# Patient Record
Sex: Male | Born: 1960 | Race: Black or African American | Hispanic: No | Marital: Married | Smoking: Current every day smoker
Health system: Southern US, Community
[De-identification: ages and names within clinical notes are randomized; demographics above are authoritative.]

## PROBLEM LIST (undated history)

## (undated) DIAGNOSIS — R011 Cardiac murmur, unspecified: Secondary | ICD-10-CM

## (undated) DIAGNOSIS — F319 Bipolar disorder, unspecified: Secondary | ICD-10-CM

## (undated) DIAGNOSIS — I10 Essential (primary) hypertension: Secondary | ICD-10-CM

## (undated) DIAGNOSIS — I219 Acute myocardial infarction, unspecified: Secondary | ICD-10-CM

## (undated) DIAGNOSIS — R519 Headache, unspecified: Secondary | ICD-10-CM

## (undated) DIAGNOSIS — R51 Headache: Secondary | ICD-10-CM

## (undated) DIAGNOSIS — F209 Schizophrenia, unspecified: Secondary | ICD-10-CM

## (undated) DIAGNOSIS — K759 Inflammatory liver disease, unspecified: Secondary | ICD-10-CM

## (undated) HISTORY — PX: COLONOSCOPY: SHX5424

---

## 2014-12-05 ENCOUNTER — Encounter (HOSPITAL_COMMUNITY): Payer: Self-pay | Admitting: Emergency Medicine

## 2014-12-05 ENCOUNTER — Emergency Department (HOSPITAL_COMMUNITY)
Admission: EM | Admit: 2014-12-05 | Discharge: 2014-12-06 | Disposition: A | Discharge status: Other Acute Inpatient Hospital | Payer: Self-pay | Attending: Emergency Medicine | Admitting: Emergency Medicine

## 2014-12-05 DIAGNOSIS — F333 Major depressive disorder, recurrent, severe with psychotic symptoms: Secondary | ICD-10-CM | POA: Insufficient documentation

## 2014-12-05 DIAGNOSIS — F1120 Opioid dependence, uncomplicated: Secondary | ICD-10-CM | POA: Insufficient documentation

## 2014-12-05 DIAGNOSIS — R109 Unspecified abdominal pain: Secondary | ICD-10-CM | POA: Insufficient documentation

## 2014-12-05 DIAGNOSIS — I10 Essential (primary) hypertension: Secondary | ICD-10-CM | POA: Insufficient documentation

## 2014-12-05 DIAGNOSIS — F1994 Other psychoactive substance use, unspecified with psychoactive substance-induced mood disorder: Secondary | ICD-10-CM | POA: Diagnosis present

## 2014-12-05 DIAGNOSIS — F329 Major depressive disorder, single episode, unspecified: Secondary | ICD-10-CM

## 2014-12-05 DIAGNOSIS — R45851 Suicidal ideations: Secondary | ICD-10-CM

## 2014-12-05 DIAGNOSIS — R440 Auditory hallucinations: Secondary | ICD-10-CM

## 2014-12-05 DIAGNOSIS — Z720 Tobacco use: Secondary | ICD-10-CM | POA: Insufficient documentation

## 2014-12-05 DIAGNOSIS — R110 Nausea: Secondary | ICD-10-CM | POA: Insufficient documentation

## 2014-12-05 DIAGNOSIS — F29 Unspecified psychosis not due to a substance or known physiological condition: Secondary | ICD-10-CM | POA: Diagnosis present

## 2014-12-05 HISTORY — DX: Essential (primary) hypertension: I10

## 2014-12-05 HISTORY — DX: Schizophrenia, unspecified: F20.9

## 2014-12-05 LAB — ACETAMINOPHEN LEVEL: Acetaminophen (Tylenol), Serum: <10 ug/mL — ABNORMAL LOW (ref 10–30)

## 2014-12-05 LAB — RAPID URINE DRUG SCREEN, HOSP PERFORMED
Amphetamines: NOT DETECTED
BENZODIAZEPINES: NOT DETECTED
Barbiturates: NOT DETECTED
COCAINE: NOT DETECTED
Opiates: NOT DETECTED
TETRAHYDROCANNABINOL: NOT DETECTED

## 2014-12-05 LAB — CBC
HEMATOCRIT: 40.1 % (ref 39.0–52.0)
Hemoglobin: 13.4 g/dL (ref 13.0–17.0)
MCH: 30.8 pg (ref 26.0–34.0)
MCHC: 33.4 g/dL (ref 30.0–36.0)
MCV: 92.2 fL (ref 78.0–100.0)
PLATELETS: 190 10*3/uL (ref 150–400)
RBC: 4.35 MIL/uL (ref 4.22–5.81)
RDW: 12.4 % (ref 11.5–15.5)
WBC: 9.3 10*3/uL (ref 4.0–10.5)

## 2014-12-05 LAB — COMPREHENSIVE METABOLIC PANEL
ALT: 85 U/L — AB (ref 17–63)
ANION GAP: 9 (ref 5–15)
AST: 63 U/L — AB (ref 15–41)
Albumin: 3.9 g/dL (ref 3.5–5.0)
Alkaline Phosphatase: 66 U/L (ref 38–126)
BUN: 21 mg/dL — AB (ref 6–20)
CALCIUM: 9.4 mg/dL (ref 8.9–10.3)
CHLORIDE: 105 mmol/L (ref 101–111)
CO2: 26 mmol/L (ref 22–32)
CREATININE: 1.05 mg/dL (ref 0.61–1.24)
GFR calc Af Amer: >60 mL/min (ref >60)
GLUCOSE: 100 mg/dL — AB (ref 65–99)
POTASSIUM: 4.2 mmol/L (ref 3.5–5.1)
Sodium: 140 mmol/L (ref 135–145)
Total Bilirubin: 1.4 mg/dL — ABNORMAL HIGH (ref 0.3–1.2)
Total Protein: 7.7 g/dL (ref 6.5–8.1)

## 2014-12-05 LAB — SALICYLATE LEVEL: Salicylate Lvl: <4 mg/dL (ref 2.8–30.0)

## 2014-12-05 LAB — ETHANOL

## 2014-12-05 MED ORDER — ONDANSETRON HCL 4 MG PO TABS: 4.0000 mg | ORAL_TABLET | Freq: Three times a day (TID) | ORAL | Status: DC | PRN

## 2014-12-05 MED ORDER — IBUPROFEN 200 MG PO TABS: 600.0000 mg | ORAL_TABLET | Freq: Three times a day (TID) | ORAL | Status: DC | PRN

## 2014-12-05 MED ORDER — NICOTINE 21 MG/24HR TD PT24
21.0000 mg | MEDICATED_PATCH | Freq: Every day | TRANSDERMAL | Status: DC
  Filled 2014-12-05 (×2): qty 1

## 2014-12-05 MED ORDER — HYDROXYZINE HCL 25 MG PO TABS: 50.0000 mg | ORAL_TABLET | Freq: Three times a day (TID) | ORAL | Status: DC | PRN

## 2014-12-05 MED ORDER — QUETIAPINE FUMARATE 50 MG PO TABS
50.0000 mg | ORAL_TABLET | Freq: Two times a day (BID) | ORAL | Status: DC
  Administered 2014-12-05 – 2014-12-06 (×2): 50 mg via ORAL
  Filled 2014-12-05 (×2): qty 1

## 2014-12-05 MED ORDER — QUETIAPINE FUMARATE 50 MG PO TABS
50.0000 mg | ORAL_TABLET | ORAL | Status: DC
  Administered 2014-12-05: 50 mg via ORAL
  Filled 2014-12-05: qty 1

## 2014-12-05 MED ORDER — ALPRAZOLAM 1 MG PO TABS
1.0000 mg | ORAL_TABLET | Freq: Two times a day (BID) | ORAL | Status: DC
  Administered 2014-12-05 (×2): 1 mg via ORAL
  Filled 2014-12-05 (×3): qty 1

## 2014-12-05 MED ORDER — TRAZODONE HCL 100 MG PO TABS
100.0000 mg | ORAL_TABLET | Freq: Every day | ORAL | Status: DC
  Administered 2014-12-05: 100 mg via ORAL
  Filled 2014-12-05: qty 1

## 2014-12-05 MED ORDER — HALOPERIDOL 5 MG PO TABS: 5.0000 mg | ORAL_TABLET | Freq: Two times a day (BID) | ORAL | Status: DC

## 2014-12-05 NOTE — ED Notes (Signed)
Pt. Noted sleeping in room. No complaints or concerns voiced. No distress or abnormal behavior noted. Will continue to monitor with security cameras. Q 15 minute rounds continue. 
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
## 2014-12-05 NOTE — ED Notes (Signed)
Pt. Noted in room. No complaints or concerns voiced. No distress or abnormal behavior noted. Will continue to monitor with security cameras. Q 15 minute rounds continue. 

## 2014-12-05 NOTE — ED Notes (Signed)
Lab into draw.Pt sleeping, tolerated procedure well. Pt sweaty, reports that he has been getting sweaty when he sleeps for a while.  Pt denies wt loss/cough.

## 2014-12-05 NOTE — ED Notes (Signed)
Eating lunch 

## 2014-12-05 NOTE — ED Notes (Signed)
Pt. To [facility] from ED ambulatory without difficulty, to room 34 . Report from [employer]. Pt. Is alert and oriented, warm and dry in no distress. Pt. Denies SI, HI, and AVH. Pt. Calm and cooperative. Pt. Made aware of security cameras and Q15 minute rounds. Pt. Encouraged to let Nursing staff know of any concerns or needs.

## 2014-12-05 NOTE — ED Notes (Signed)
[facility] into see

## 2014-12-05 NOTE — ED Notes (Signed)
Report received from Janie Rambo RN. Pt. Sleeping, respirations regular and unlabored. Will continue to monitor for safety via security cameras and Q 15 minute checks. 

## 2014-12-05 NOTE — ED Provider Notes (Signed)
CSN: [id number]     Arrival date & time 12/05/14  0407 History   First MD Initiated Contact with Patient 12/05/14 [phone]     Chief Complaint  Patient presents with  . Suicidal     (Consider location/radiation/quality/duration/timing/severity/associated sxs/prior Treatment) HPI Comments: Patient was discharged from a psychiatric hospital in [place].  He was told to take a train to [place] to stay at the [facility] and to follow-up with [facility].  Patient states he got off the bus.  He hadn't eaten anything in the day and half.  He had some abdominal cramping.  He felt very anxious.  He caught his left wrist with an unknown sharp object and called the ambulance to bring him to the hospital because he was feeling suicidal.  He does have a long-standing history of psychiatric illness.  He does take Desyrel and Seroquel and Haldol and Xanax, which he states he hasn't taken in a while.  Patient is a 54 y.o. male presenting with abdominal pain. The history is provided by the patient.  Abdominal Pain Pain location:  Generalized Pain quality: cramping   Pain radiates to:  Does not radiate Pain severity:  Mild Onset quality:  Gradual Timing:  Intermittent Chronicity:  New Context comment:  Has aeaten for a  day and half Relieved by:  None tried Worsened by:  Nothing tried Ineffective treatments:  None tried Associated symptoms: nausea   Associated symptoms: no chills, no constipation, no cough, no diarrhea, no dysuria and no fever     Past Medical History  Diagnosis Date  . Hypertension   . Schizophrenia    No past surgical history on file. Family History  Problem Relation Age of Onset  . Alcoholism Father    Social History  Substance Use Topics  . Smoking status: Current Every Day Smoker -- 0.50 packs/day    Types: Cigarettes  . Smokeless tobacco: Never Used  . Alcohol Use: No    Review of Systems  Constitutional: Negative for fever and chills.   Respiratory: Negative for cough.   Gastrointestinal: Positive for nausea and abdominal pain. Negative for diarrhea and constipation.  Genitourinary: Negative for dysuria.  Neurological: Positive for dizziness.  All other systems reviewed and are negative.     Allergies  Review of patient's allergies indicates no known allergies.  Home Medications   Prior to Admission medications   Medication Sig Start Date End Date Taking? Authorizing Provider  haloperidol (HALDOL) 5 MG tablet Take 5 mg by mouth 2 (two) times daily.   Yes Historical Provider, MD  hydrOXYzine (ATARAX/VISTARIL) 50 MG tablet Take 50 mg by mouth 3 (three) times daily as needed for anxiety.   Yes Historical Provider, MD  QUEtiapine (SEROQUEL) 50 MG tablet Take 50 mg by mouth every morning.   Yes Historical Provider, MD  traZODone (DESYREL) 100 MG tablet Take 100 mg by mouth at bedtime.   Yes Historical Provider, MD   BP 103/60 mmHg  Pulse 72  Temp(Src) 98.5 F (36.9 C) (Oral)  Resp 18  SpO2 96% Physical Exam  Constitutional: He is oriented to person, place, and time. He appears well-developed and well-nourished.  HENT:  Head: Normocephalic.  Eyes: Pupils are equal, round, and reactive to light.  Neck: Normal range of motion.  Cardiovascular: Normal rate and regular rhythm.   Pulmonary/Chest: Effort normal and breath sounds normal.  Abdominal: Soft. Bowel sounds are normal. He exhibits no distension.  Musculoskeletal: Normal range of motion.  Neurological:  He is alert and oriented to person, place, and time.  Skin: Skin is warm and dry.  Psychiatric: His speech is normal and behavior is normal. His mood appears anxious. Cognition and memory are normal. He expresses inappropriate judgment. He exhibits a depressed mood. He expresses suicidal ideation. He expresses suicidal plans.  Nursing note and vitals reviewed.   ED Course  Procedures (including critical care time) Labs Review Labs Reviewed  COMPREHENSIVE  METABOLIC PANEL - Abnormal; Notable for the following:    Glucose, Bld 100 (*)    BUN 21 (*)    AST 63 (*)    ALT 85 (*)    Total Bilirubin 1.4 (*)    All other components within normal limits  ACETAMINOPHEN LEVEL - Abnormal; Notable for the following:    Acetaminophen (Tylenol), Serum <10 (*)    All other components within normal limits  HEPATITIS C ANTIBODY - Abnormal; Notable for the following:    HCV Ab >11.0 (*)    All other components within normal limits  ETHANOL  SALICYLATE LEVEL  CBC  URINE RAPID DRUG SCREEN, HOSP PERFORMED  HEPATITIS A ANTIBODY, TOTAL  HEPATITIS B CORE ANTIBODY, TOTAL  HIV ANTIBODY (ROUTINE TESTING)    Imaging Review No results found. I, Dafney Farler K, personally reviewed and evaluated these images and lab results as part of my medical decision-making.   EKG Interpretation None      MDM   Final diagnoses:  MDD (major depressive disorder), recurrent, severe, with psychosis  Heroin dependence  Suicidal ideation         Junius Creamer, NP 12/08/14 1953  Varney Biles, MD 12/09/14 1016
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
## 2014-12-05 NOTE — ED Notes (Signed)
Pt states he has had continuous suicidal thoughts for "a while.'  Pt states that he got off the bus and was feeling anxious.  Pt states he has the shakes, nauseated, and doesn't feel well.

## 2014-12-05 NOTE — BH Assessment (Signed)
Received notification of TTS consult request. Spoke to Junius Creamer, NP who said Pt has a history of mental health problems and suicidal ideation. Tele-assessment will be initiated.  Orpah Greek Anson Fret, [place], [facility], [facility] Triage Specialist [phone]

## 2014-12-05 NOTE — ED Notes (Signed)
Patient arrives by EMS, states he took a train from [place] to [place] and has been staying at [facility].  Patient states he has been off his medications a week ago last Thursday.  Patient complaining of abdominal cramping, no diarrhea, complaining of dizziness, nausea, no vomiting.  Patient states suicidal thoughts and per EMS, patient stated he cut his wrists 2 days ago.

## 2014-12-06 ENCOUNTER — Inpatient Hospital Stay (HOSPITAL_COMMUNITY)
Admission: AD | Admit: 2014-12-06 | Discharge: 2014-12-13 | DRG: 885 | Disposition: A | Discharge status: Home / Self Care | Payer: Federal, State, Local not specified - Other | Source: Intra-hospital | Attending: Emergency Medicine | Admitting: Emergency Medicine

## 2014-12-06 ENCOUNTER — Encounter (HOSPITAL_COMMUNITY): Payer: Self-pay | Admitting: *Deleted

## 2014-12-06 DIAGNOSIS — I10 Essential (primary) hypertension: Secondary | ICD-10-CM | POA: Diagnosis present

## 2014-12-06 DIAGNOSIS — Y92239 Unspecified place in hospital as the place of occurrence of the external cause: Secondary | ICD-10-CM | POA: Diagnosis not present

## 2014-12-06 DIAGNOSIS — F22 Delusional disorders: Secondary | ICD-10-CM | POA: Diagnosis present

## 2014-12-06 DIAGNOSIS — F1120 Opioid dependence, uncomplicated: Secondary | ICD-10-CM

## 2014-12-06 DIAGNOSIS — F250 Schizoaffective disorder, bipolar type: Principal | ICD-10-CM | POA: Diagnosis present

## 2014-12-06 DIAGNOSIS — W19XXXA Unspecified fall, initial encounter: Secondary | ICD-10-CM

## 2014-12-06 DIAGNOSIS — F17210 Nicotine dependence, cigarettes, uncomplicated: Secondary | ICD-10-CM | POA: Diagnosis present

## 2014-12-06 DIAGNOSIS — W010XXA Fall on same level from slipping, tripping and stumbling without subsequent striking against object, initial encounter: Secondary | ICD-10-CM | POA: Diagnosis not present

## 2014-12-06 DIAGNOSIS — F419 Anxiety disorder, unspecified: Secondary | ICD-10-CM | POA: Diagnosis present

## 2014-12-06 DIAGNOSIS — Z560 Unemployment, unspecified: Secondary | ICD-10-CM | POA: Diagnosis present

## 2014-12-06 DIAGNOSIS — Z915 Personal history of self-harm: Secondary | ICD-10-CM

## 2014-12-06 DIAGNOSIS — Z811 Family history of alcohol abuse and dependence: Secondary | ICD-10-CM

## 2014-12-06 DIAGNOSIS — Z590 Homelessness: Secondary | ICD-10-CM

## 2014-12-06 DIAGNOSIS — F333 Major depressive disorder, recurrent, severe with psychotic symptoms: Secondary | ICD-10-CM

## 2014-12-06 DIAGNOSIS — G4700 Insomnia, unspecified: Secondary | ICD-10-CM | POA: Diagnosis present

## 2014-12-06 DIAGNOSIS — B1920 Unspecified viral hepatitis C without hepatic coma: Secondary | ICD-10-CM | POA: Clinically undetermined

## 2014-12-06 LAB — HEPATITIS C ANTIBODY

## 2014-12-06 LAB — HIV ANTIBODY (ROUTINE TESTING W REFLEX): HIV SCREEN 4TH GENERATION: NONREACTIVE

## 2014-12-06 LAB — HEPATITIS B CORE ANTIBODY, TOTAL: HEP B C TOTAL AB: NEGATIVE

## 2014-12-06 LAB — HEPATITIS A ANTIBODY, TOTAL: Hep A Total Ab: NEGATIVE

## 2014-12-06 MED ORDER — NICOTINE 21 MG/24HR TD PT24
21.0000 mg | MEDICATED_PATCH | Freq: Every day | TRANSDERMAL | Status: DC
  Administered 2014-12-07 – 2014-12-11 (×5): 21 mg via TRANSDERMAL
  Filled 2014-12-06 (×4): qty 1
  Filled 2014-12-06 (×2): qty 14
  Filled 2014-12-06: qty 1
  Filled 2014-12-06 (×2): qty 14

## 2014-12-06 MED ORDER — IBUPROFEN 600 MG PO TABS
600.0000 mg | ORAL_TABLET | Freq: Three times a day (TID) | ORAL | Status: DC | PRN
  Administered 2014-12-08 – 2014-12-11 (×5): 600 mg via ORAL
  Filled 2014-12-06 (×5): qty 1

## 2014-12-06 MED ORDER — TRAZODONE HCL 100 MG PO TABS
100.0000 mg | ORAL_TABLET | Freq: Every day | ORAL | Status: DC
  Administered 2014-12-07 – 2014-12-09 (×3): 100 mg via ORAL
  Filled 2014-12-06 (×5): qty 1

## 2014-12-06 MED ORDER — MAGNESIUM HYDROXIDE 400 MG/5ML PO SUSP: 30.0000 mL | Freq: Every day | ORAL | Status: DC | PRN

## 2014-12-06 MED ORDER — QUETIAPINE FUMARATE 50 MG PO TABS
50.0000 mg | ORAL_TABLET | Freq: Two times a day (BID) | ORAL | Status: DC
  Administered 2014-12-06 – 2014-12-10 (×8): 50 mg via ORAL
  Filled 2014-12-06 (×11): qty 1
  Filled 2014-12-06 (×2): qty 28

## 2014-12-06 MED ORDER — ACETAMINOPHEN 325 MG PO TABS
650.0000 mg | ORAL_TABLET | Freq: Four times a day (QID) | ORAL | Status: DC | PRN
  Administered 2014-12-06 – 2014-12-09 (×5): 650 mg via ORAL
  Filled 2014-12-06 (×6): qty 2

## 2014-12-06 MED ORDER — HYDROXYZINE HCL 50 MG PO TABS
50.0000 mg | ORAL_TABLET | Freq: Three times a day (TID) | ORAL | Status: DC | PRN
  Administered 2014-12-07 – 2014-12-12 (×4): 50 mg via ORAL
  Filled 2014-12-06 (×3): qty 1
  Filled 2014-12-06: qty 20
  Filled 2014-12-06: qty 1

## 2014-12-06 MED ORDER — ALUM & MAG HYDROXIDE-SIMETH 200-200-20 MG/5ML PO SUSP: 30.0000 mL | ORAL | Status: DC | PRN

## 2014-12-06 MED ORDER — ENSURE ENLIVE PO LIQD
237.0000 mL | Freq: Two times a day (BID) | ORAL | Status: DC
  Administered 2014-12-07 – 2014-12-12 (×9): 237 mL via ORAL
  Filled 2014-12-06: qty 237

## 2014-12-06 MED ORDER — ONDANSETRON HCL 4 MG PO TABS: 4.0000 mg | ORAL_TABLET | Freq: Three times a day (TID) | ORAL | Status: DC | PRN

## 2014-12-06 NOTE — ED Notes (Signed)
Pt. Noted sleeping in room. No complaints or concerns voiced. No distress or abnormal behavior noted. Will continue to monitor with security cameras. Q 15 minute rounds continue. 

## 2014-12-06 NOTE — Progress Notes (Signed)
Patient ID: Ronnie Burton, male   DOB: 1960/08/29, 54 y.o.   MRN: [id number] D: Client in his room most of this shift, pleasant, reports came to [facility] for "a new start" notes that his ex-GF once lived here and suggested he come. Client reports he moved here from [place].  "here to get straightened out" "get the help I need to stay on my medicine" A: Writer reviewed medication, administered as prescribed, encouraged client to attend group. Staff will monitor q27min for safety. R:Client is safe on the unit.

## 2014-12-06 NOTE — Tx Team (Signed)
Initial Interdisciplinary Treatment Plan   PATIENT STRESSORS: Homelessness Substance abuse  PATIENT STRENGTHS: Ability for insight General fund of knowledge   PROBLEM LIST: Problem List/Patient Goals Date to be addressed Date deferred Reason deferred Estimated date of resolution  Suicidal ideation 8/16   D/c  Substance abuse/heroin 8/16   D/c  Homelessness/financial difficulty 8/16   D/c                                       DISCHARGE CRITERIA:  Ability to meet basic life and health needs Motivation to continue treatment in a less acute level of care Safe-care adequate arrangements made  PRELIMINARY DISCHARGE PLAN: Attend PHP/IOP Outpatient therapy Placement in alternative living arrangements  PATIENT/FAMIILY INVOLVEMENT: This treatment plan has been presented to and reviewed with the patient, Ronnie Burton, and/or family member,  The patient and family have been given the opportunity to ask questions and make suggestions.  Jobe Igo 12/06/2014, 7:20 PM
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
## 2014-12-06 NOTE — BH Assessment (Signed)
[place] Assessment Progress Note  Per Mojeed [email or web address].MD, this pt requires psychiatric hospitalization at this time.  Debarah Crape, RN, [facility] has assigned pt to [facility] Rm [id number].  Pt has signed Voluntary Admission and Consent for Treatment, as well as Consent to Release Information, and a notification call has been placed.  Signed forms have been faxed to [facility].  Pt's nurse, Nena Jordan, has been notified, and agrees to send original paperwork along with pt via [facility], and to call report to [phone].  Jalene Mullet, [place] Triage Specialist [phone]

## 2014-12-06 NOTE — Progress Notes (Signed)
Nursing admit note-  Ronnie Burton is a 54y/o AAM admitted voluntarily following medical clearance from [facility].  He presented with SI with a plan to cut his wrist following relocation from [place]. Is homeless with no support system.  Reports long history of heroin abuse with last use 2 weeks ago.  States multiple hospitilizations.  Currently pleasant and cooperative with vague thoughts of suicide and no plan.  Contracts for safety.  No physical complaints.  Currently reports mild anxiety.  Also reports recent 30lb weight loss.  Desires help with depressive symptoms and housing assistance.  Food/fluids given.  Orientation to unit done and POC explained.  15' checks initiated for safety.  NAD.

## 2014-12-07 ENCOUNTER — Telehealth: Payer: Self-pay | Admitting: Internal Medicine

## 2014-12-07 ENCOUNTER — Telehealth (HOSPITAL_COMMUNITY): Payer: Self-pay

## 2014-12-07 ENCOUNTER — Encounter (HOSPITAL_COMMUNITY): Payer: Self-pay | Admitting: Psychiatry

## 2014-12-07 DIAGNOSIS — F250 Schizoaffective disorder, bipolar type: Principal | ICD-10-CM | POA: Diagnosis present

## 2014-12-07 DIAGNOSIS — B1920 Unspecified viral hepatitis C without hepatic coma: Secondary | ICD-10-CM | POA: Clinically undetermined

## 2014-12-07 DIAGNOSIS — F1120 Opioid dependence, uncomplicated: Secondary | ICD-10-CM | POA: Diagnosis present

## 2014-12-07 MED ORDER — GABAPENTIN 100 MG PO CAPS
100.0000 mg | ORAL_CAPSULE | Freq: Three times a day (TID) | ORAL | Status: DC
  Administered 2014-12-07 – 2014-12-08 (×4): 100 mg via ORAL
  Filled 2014-12-07 (×9): qty 1
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
## 2014-12-07 NOTE — Plan of Care (Signed)
Problem: Diagnosis: Increased Risk For Suicide Attempt Goal: LTG-Patient Will Report Improved Mood and Deny Suicidal LTG (by discharge) Patient will report improved mood and deny suicidal ideation.  Outcome: Progressing Client is safe on the unit AEB denys SHI, q15min safety checks.

## 2014-12-07 NOTE — Progress Notes (Signed)
D: Pt presents with flat affect and depressed mood which has brightened up as shift progressed. Pt denies SI, HI, AVH and pain this AM when assessed. Pt is guarded with minimal interactions noted with others, however, he's pleasant. Pt did not attend AM nursing group when prompted stated "I'm tired" but he did attend the noon group.  A: Verbal education done on prescribed medications prior to administration. EKG done as per MD's orders. Emotional support and availability offered to pt. Pt encouraged to voice needs and concerns. Q 15 minutes checks done for safety as order without gestures of self injurious behavior or outburst to note at this time.  R: Pt took his medications when offered. Denies adverse drug reactions when assessed. Tolerated EKG procedure and meals well. Remains safe on and off unit.

## 2014-12-07 NOTE — [facility] Group Notes (Signed)
[facility] LCSW Group Therapy  12/07/2014 , 1:33 PM   Type of Therapy:  Group Therapy  Participation Level:  Active  Participation Quality:  Attentive  Affect:  Appropriate  Cognitive:  Alert  Insight:  Improving  Engagement in Therapy:  Engaged  Modes of Intervention:  Discussion, Exploration and Socialization  Summary of Progress/Problems: Today's group focused on the term Diagnosis.  Participants were asked to define the term, and then pronounce whether it is a negative, positive or neutral term.  Invited.  Chose to not attend.  Ronnie Burton 12/07/2014 , 1:33 PM

## 2014-12-07 NOTE — [facility] Counselor (Signed)
CSW attempted to complete PSA but patient declined at this time, reports feeling tired and asked for CSW to come back at a later time.  Tilden Fossa, MSW, [place] Worker [facility] [phone]

## 2014-12-07 NOTE — H&P (Signed)
Psychiatric Admission Assessment Adult  Patient Identification: Ronnie Burton MRN:  [id number] Date of Evaluation:  12/07/2014 Chief Complaint:Pt states " I was depressed and suicidal.'       Principal Diagnosis: Schizoaffective disorder, bipolar type versus substance induced ( opioid ) bipolar and related disorder. Diagnosis:   Patient Active Problem List   Diagnosis Date Noted  . Opioid use disorder, severe, dependence [F11.20] 12/07/2014  . Hepatitis C [B19.20] 12/07/2014  . Schizoaffective disorder, bipolar type [F25.0] 12/07/2014   History of Present Illness:: Pt is a 54 y.o. AA male, divorced, unemployed , who has a hx of schizophrenia vs Bipolar do as well as severe opioid use disorder, presented to  to [facility] ED reporting symptoms of depression and command auditory hallucinations to kill himself. Pt per initial notes in EHR " reported he was discharged from [facility] in [place] two days ago after being admitted for cutting his wrist in a suicide attempt. Pt says he was discharged on 12/03/14 and given a bus pass to [place] with discharged instructions to go to [employer] and a list of behavior health resources [place]. Pt states he did not have money to purchase his psychiatric medications. Pt states he arrived in [place] and came to [facility] because he was suicidal with thoughts of cutting his wrist. Pt also reported hearing command auditory hallucinations to "do it" and kill himself."  Pt seen this AM. Pt today appears irritable - states he needs some sleep after the 18 hr long bus trip. Pt reports periodic mood lability when he is either irritable , angry or happy and does reckless things like abuse drugs or spend money inappropriately. Pt also reports AH asking him to kill self. Pt reports paranoia that people are out to get him from time to time. Pt reports appetite as good . Pt currently has SI , with the same plan.   Pt reports that  this is his 4 th hospitalization. He has been admitted three times in [place] . Pt has had several suicide attempts - he recently tried to cut his wrist as described above.  Pt reports abusing IV heroin on a regular basis. He last used it couple of weeks ago. Pt reports having withdrawal sx like having cold sweats when not using it. Pt also has an ongoing BL tremors of his hands , which is chronic. Pt reports being prescribed xanax for the same.  Pt with recent Hep C ab positive while in ED - discussed that he will need follow up on the same. Pt reports that he was never diagnosed with Hep c in the past.     Elements:  Location:  depression, mood lability, psychosis. Quality:  see above. Severity: severe. Timing:  acute Duration: past 3 days  Context:  ..schizophrenia per hx, opioid use do Associated Signs/Symptoms: Depression Symptoms:  anhedonia, anxiety, panic attacks, (Hypo) Manic Symptoms:  Distractibility, Elevated Mood, Hallucinations, Impulsivity, Irritable Mood, Labiality of Mood, Anxiety Symptoms:  denies Psychotic Symptoms:  Delusions, Hallucinations: Auditory Paranoia, PTSD Symptoms: Negative Total Time spent with patient: 1 hour  Past Medical History:  Past Medical History  Diagnosis Date  . Hypertension   . Schizophrenia    History reviewed. No pertinent past surgical history. Family History:  Family History  Problem Relation Age of Onset  . Alcoholism Father    Social History:  History  Alcohol Use No     History  Drug Use No    Social History  Social History  . Marital Status: Single    Spouse Name: N/A  . Number of Children: N/A  . Years of Education: N/A   Social History Main Topics  . Smoking status: Current Every Day Smoker -- 0.50 packs/day    Types: Cigarettes  . Smokeless tobacco: Never Used  . Alcohol Use: No  . Drug Use: No  . Sexual Activity: Yes   Other Topics Concern  . None   Social History Narrative   Additional Social  History:    Pain Medications: Denies abuse Prescriptions: See MAR Over the Counter: See MAR Longest period of sobriety (when/how long): Two years Negative Consequences of Use: Financial, Scientist, research (physical sciences), Personal relationships, Work / School Name of Substance 1: Heroin (I.V and inhalation) 1 - Age of First Use: 30 1 - Amount (size/oz): 10+ bags 1 - Frequency: Daily 1 - Duration: Ongoing for years 1 - Last Use / Amount: Two weeks ago          Patient was born [place]. Pt went up to 12 th grade. Pt is divorced . Pt has a daughter in [place] and mother in [place]. Pt denies legal issues. Pt is unemployed - wants to apply for SSD.         Musculoskeletal: Strength & Muscle Tone: within normal limits Gait & Station: normal Patient leans: N/A  Psychiatric Specialty Exam: Physical Exam  Constitutional: He is oriented to person, place, and time. He appears well-developed and well-nourished.  HENT:  Head: Normocephalic and atraumatic.  Eyes: Conjunctivae and EOM are normal.  Neck: Normal range of motion. Neck supple. No thyromegaly present.  Cardiovascular: Normal rate and regular rhythm.   Respiratory: Effort normal and breath sounds normal.  GI: Soft. Bowel sounds are normal.  Musculoskeletal: Normal range of motion. He exhibits no edema.  Neurological: He is alert and oriented to person, place, and time.  Skin: Skin is warm.  Psychiatric: His speech is normal. His mood appears anxious. He is actively hallucinating. Thought content is paranoid and delusional. Cognition and memory are normal. He expresses impulsivity. He exhibits a depressed mood.    Review of Systems  Psychiatric/Behavioral: Positive for depression, suicidal ideas, hallucinations and substance abuse. The patient has insomnia.   All other systems reviewed and are negative.   Blood pressure 108/72, pulse 88, temperature 97.7 F (36.5 C), temperature source Oral, resp. rate 20, height 6' 5"  (1.956 m), weight 81.647 kg (180 lb), SpO2 100  %.Body mass index is 21.34 kg/(m^2).  General Appearance: Disheveled  Eye Contact::  Poor  Speech:  Normal Rate  Volume:  Normal  Mood:  Anxious and Irritable  Affect:  Congruent  Thought Process:  Irrelevant  Orientation:  Full (Time, Place, and Person)  Thought Content:  Delusions, Hallucinations: Auditory, Paranoid Ideation and Rumination  Suicidal Thoughts:  Yes.  with intent/plan  Homicidal Thoughts:  No  Memory:  Immediate;   Fair Recent;   Fair Remote;   Fair  Judgement:  Impaired  Insight:  Lacking  Psychomotor Activity:  Normal  Concentration:  Fair  Recall:  [employer] of Knowledge:Fair  Language: Fair  Akathisia:  No  Handed:  Right  AIMS (if indicated):     Assets:  Desire for Improvement  ADL's:  Intact  Cognition: WNL  Sleep:  Number of Hours: 6.75   Risk to Self:  YES Risk to Others:  DENIES Prior Inpatient Therapy:  YES- 3X IN [place] Prior Outpatient Therapy:  YES ,IN [place]  Alcohol Screening:  1. How often do you have a drink containing alcohol?: Never 9. Have you or someone else been injured as a result of your drinking?: No 10. Has a relative or friend or a doctor or another health worker been concerned about your drinking or suggested you cut down?: No Alcohol Use Disorder Identification Test Final Score (AUDIT): 0  Allergies:  No Known Allergies Lab Results: No results found for this or any previous visit (from the past 76 hour(s)). Current Medications: Current Facility-Administered Medications  Medication Dose Route Frequency Provider Last Rate Last Dose  . acetaminophen (TYLENOL) tablet 650 mg  650 mg Oral Q6H PRN Patrecia Pour, NP   650 mg at 12/06/14 2046  . alum & mag hydroxide-simeth (MAALOX/MYLANTA) 200-200-20 MG/5ML suspension 30 mL  30 mL Oral Q4H PRN Patrecia Pour, NP      . feeding supplement (ENSURE ENLIVE) (ENSURE ENLIVE) liquid 237 mL  237 mL Oral BID BM Norena Bratton, MD   237 mL at 12/07/14 1054  . gabapentin (NEURONTIN) capsule 100 mg   100 mg Oral TID Ursula Alert, MD   100 mg at 12/07/14 1247  . hydrOXYzine (ATARAX/VISTARIL) tablet 50 mg  50 mg Oral TID PRN Patrecia Pour, NP      . ibuprofen (ADVIL,MOTRIN) tablet 600 mg  600 mg Oral Q8H PRN Patrecia Pour, NP      . magnesium hydroxide (MILK OF MAGNESIA) suspension 30 mL  30 mL Oral Daily PRN Patrecia Pour, NP      . nicotine (NICODERM CQ - dosed in mg/24 hours) patch 21 mg  21 mg Transdermal Daily Patrecia Pour, NP   21 mg at 12/07/14 0835  . ondansetron (ZOFRAN) tablet 4 mg  4 mg Oral Q8H PRN Patrecia Pour, NP      . QUEtiapine (SEROQUEL) tablet 50 mg  50 mg Oral BID Patrecia Pour, NP   50 mg at 12/07/14 0834  . traZODone (DESYREL) tablet 100 mg  100 mg Oral QHS Patrecia Pour, NP   100 mg at 12/06/14 2200   PTA Medications: Prescriptions prior to admission  Medication Sig Dispense Refill Last Dose  . haloperidol (HALDOL) 5 MG tablet Take 5 mg by mouth 2 (two) times daily.   Past Month at Unknown time  . hydrOXYzine (ATARAX/VISTARIL) 50 MG tablet Take 50 mg by mouth 3 (three) times daily as needed for anxiety.   Past Month at Unknown time  . QUEtiapine (SEROQUEL) 50 MG tablet Take 50 mg by mouth every morning.   Past Month at Unknown time  . traZODone (DESYREL) 100 MG tablet Take 100 mg by mouth at bedtime.   Past Month at Unknown time    Previous Psychotropic Medications: Yes - seroquel   Substance Abuse History in the last 12 months:  Yes.  heroin IV abuse - chronic -last use 2 weeks ago. Smokes cigarettes.    Consequences of Substance Abuse: Medical Consequences:  recent admission Withdrawal Symptoms:   Diaphoresis Tremors  Results for orders placed or performed during the hospital encounter of 12/05/14 (from the past 72 hour(s))  Comprehensive metabolic panel     Status: Abnormal   Collection Time: 12/05/14  5:16 AM  Result Value Ref Range   Sodium 140 135 - 145 mmol/L   Potassium 4.2 3.5 - 5.1 mmol/L   Chloride 105 101 - 111 mmol/L   CO2 26 22 -  32 mmol/L   Glucose, Bld 100 (H) 65 - 99  mg/dL   BUN 21 (H) 6 - 20 mg/dL   Creatinine, Ser 1.05 0.61 - 1.24 mg/dL   Calcium 9.4 8.9 - 10.3 mg/dL   Total Protein 7.7 6.5 - 8.1 g/dL   Albumin 3.9 3.5 - 5.0 g/dL   AST 63 (H) 15 - 41 U/L   ALT 85 (H) 17 - 63 U/L   Alkaline Phosphatase 66 38 - 126 U/L   Total Bilirubin 1.4 (H) 0.3 - 1.2 mg/dL   GFR calc non Af Amer >60 >60 mL/min   GFR calc Af Amer >60 >60 mL/min    Comment: (NOTE) The eGFR has been calculated using the CKD EPI equation. This calculation has not been validated in all clinical situations. eGFR's persistently <60 mL/min signify possible Chronic Kidney Disease.    Anion gap 9 5 - 15  Ethanol (ETOH)     Status: None   Collection Time: 12/05/14  5:16 AM  Result Value Ref Range   Alcohol, Ethyl (B) <5 <5 mg/dL    Comment:        LOWEST DETECTABLE LIMIT FOR SERUM ALCOHOL IS 5 mg/dL FOR MEDICAL PURPOSES ONLY   Salicylate level     Status: None   Collection Time: 12/05/14  5:16 AM  Result Value Ref Range   Salicylate Lvl <8.1 2.8 - 30.0 mg/dL  Acetaminophen level     Status: Abnormal   Collection Time: 12/05/14  5:16 AM  Result Value Ref Range   Acetaminophen (Tylenol), Serum <10 (L) 10 - 30 ug/mL    Comment:        THERAPEUTIC CONCENTRATIONS VARY SIGNIFICANTLY. A RANGE OF 10-30 ug/mL MAY BE AN EFFECTIVE CONCENTRATION FOR MANY PATIENTS. HOWEVER, SOME ARE BEST TREATED AT CONCENTRATIONS OUTSIDE THIS RANGE. ACETAMINOPHEN CONCENTRATIONS >150 ug/mL AT 4 HOURS AFTER INGESTION AND >50 ug/mL AT 12 HOURS AFTER INGESTION ARE OFTEN ASSOCIATED WITH TOXIC REACTIONS.   CBC     Status: None   Collection Time: 12/05/14  5:16 AM  Result Value Ref Range   WBC 9.3 4.0 - 10.5 K/uL   RBC 4.35 4.22 - 5.81 MIL/uL   Hemoglobin 13.4 13.0 - 17.0 g/dL   HCT 40.1 39.0 - 52.0 %   MCV 92.2 78.0 - 100.0 fL   MCH 30.8 26.0 - 34.0 pg   MCHC 33.4 30.0 - 36.0 g/dL   RDW 12.4 11.5 - 15.5 %   Platelets 190 150 - 400 K/uL  Urine rapid  drug screen (hosp performed) (Not at [facility])     Status: None   Collection Time: 12/05/14  5:46 AM  Result Value Ref Range   Opiates NONE DETECTED NONE DETECTED   Cocaine NONE DETECTED NONE DETECTED   Benzodiazepines NONE DETECTED NONE DETECTED   Amphetamines NONE DETECTED NONE DETECTED   Tetrahydrocannabinol NONE DETECTED NONE DETECTED   Barbiturates NONE DETECTED NONE DETECTED    Comment:        DRUG SCREEN FOR MEDICAL PURPOSES ONLY.  IF CONFIRMATION IS NEEDED FOR ANY PURPOSE, NOTIFY LAB WITHIN 5 DAYS.        LOWEST DETECTABLE LIMITS FOR URINE DRUG SCREEN Drug Class       Cutoff (ng/mL) Amphetamine      1000 Barbiturate      200 Benzodiazepine   275 Tricyclics       170 Opiates          300 Cocaine          300 THC  50   Hepatitis C antibody     Status: Abnormal   Collection Time: 12/05/14 12:39 PM  Result Value Ref Range   HCV Ab >11.0 (H) 0.0 - 0.9 s/co ratio    Comment: (NOTE)                                  Negative:     < 0.8                             Indeterminate: 0.8 - 0.9                                  Positive:     > 0.9 The CDC recommends that a positive HCV antibody result be followed up with a HCV Nucleic Acid Amplification test (696789). Performed At: [facility] [place], [place] [id number] Lindon Romp MD [id number]   Hepatitis A antibody, total     Status: None   Collection Time: 12/05/14 12:39 PM  Result Value Ref Range   Hep A Total Ab Negative Negative    Comment: (NOTE) Performed At: [facility] [address] [place], [place] [id number] Lindon Romp MD [id number]   Hepatitis B core antibody, total     Status: None   Collection Time: 12/05/14 12:39 PM  Result Value Ref Range   Hep B Core Total Ab Negative Negative    Comment: (NOTE) Performed At: [facility] [place], [place] [id number] Lindon Romp MD [id number]   HIV antibody     Status: None    Collection Time: 12/05/14 12:39 PM  Result Value Ref Range   HIV Screen 4th Generation wRfx Non Reactive Non Reactive    Comment: (NOTE) Performed At: [facility] [place], [place] [id number] Lindon Romp MD [id number]     Observation Level/Precautions:  15 minute checks  Laboratory:  . Will get Hba1c,EKG,TSH,lipid panel,UDS ,CMP,CBC, PL  if not already done   Psychotherapy:  Individual and group therapy   Medications:  See below  Consultations:  Social worker  Discharge Concerns:  Stability and safety  Estimated LOS:  Other:     Psychological Evaluations: No   Treatment Plan Summary: Daily contact with patient to assess and evaluate symptoms and progress in treatment and Medication management   Patient will benefit from inpatient treatment and stabilization.  Estimated length of stay is 5-7 days.  Reviewed past medical records,treatment plan.  Will continue Seroquel 50 mg po bid as scheduled for psychosis. Will add Gabapentin 100 mg po tid for mood sx,tremors. Will continue trazodone 100 mg po qhs for sleep. Will make available Vistaril 25 mg po tid prn for anxiety sx.   Will continue to monitor vitals ,medication compliance and treatment side effects while patient is here.  Will monitor for medical issues as well as call consult as needed.  Reviewed labs ,cmp reviewed- AST/ALT elevated - Hep C ab positive - will get Hospitalist consult for further recommendations. HIV-non reactive, Hep a,b - non reactive. Will get TSH . CSW will start working on disposition. Will get collateral info from [place]. Patient to participate in therapeutic milieu .       Medical Decision Making:  New problem, with additional work up planned,  Review of Psycho-Social Stressors (1), Review or order clinical lab tests (1), Established Problem, Worsening (2), Review of Last Therapy Session (1), Review of Medication Regimen & Side Effects (2) and Review of New  Medication or Change in Dosage (2)  I certify that inpatient services furnished can reasonably be expected to improve the patient's condition.   Lowen Mansouri MD 8/16/20161:56 PM

## 2014-12-07 NOTE — Progress Notes (Signed)
NUTRITION ASSESSMENT  Pt identified as at risk on the Malnutrition Screen Tool  INTERVENTION: 1. Educated patient on the importance of nutrition and encouraged intake of food and beverages. 2. Discussed weight goals. 3. Supplements: continue Ensure Enlive BID, each supplement provides 350 kcal and 20 grams of protein  NUTRITION DIAGNOSIS: Unintentional weight loss related to sub-optimal intake as evidenced by pt report.   Goal: Pt to meet >/= 90% of their estimated nutrition needs.  Monitor:  PO intake  Assessment:  Pt seen for MST. Pt is homeless with SI. He was using heroin PTA. Per notes, pt reports 30 lb weight loss. This wound indicate 14% body weight in unknown time frame.  Ensure Enlive has been ordered BID.  54 y.o. male  Height: Ht Readings from Last 1 Encounters:  12/06/14 6\' 5"  (1.956 m)    Weight: Wt Readings from Last 1 Encounters:  12/06/14 180 lb (81.647 kg)    Weight Hx: Wt Readings from Last 10 Encounters:  12/06/14 180 lb (81.647 kg)    BMI:  Body mass index is 21.34 kg/(m^2). Pt meets criteria for normal weight based on current BMI.  Estimated Nutritional Needs: Kcal: 25-30 kcal/kg Protein: > 1 gram protein/kg Fluid: 1 ml/kcal  Diet Order: Diet Heart Room service appropriate?: Yes; Fluid consistency:: Thin Pt is also offered choice of unit snacks mid-morning and mid-afternoon.  Pt is eating as desired.   Lab results and medications reviewed.      Jarome Matin, RD, LDN Inpatient Clinical Dietitian Pager # [phone] After hours/weekend pager # [phone]

## 2014-12-07 NOTE — BHH Suicide Risk Assessment (Signed)
[facility] Admission Suicide Risk Assessment   Nursing information obtained from:  Patient Demographic factors:  Male Current Mental Status:  Suicidal ideation indicated by patient Loss Factors:  Financial problems / change in socioeconomic status Historical Factors:  Family history of suicide Risk Reduction Factors:  Positive therapeutic relationship Total Time spent with patient: 30 minutes Principal Problem: Major depressive disorder, recurrent episode, severe, with psychotic behavior Diagnosis:   Patient Active Problem List   Diagnosis Date Noted  . Opioid use disorder, severe, dependence [F11.20] 12/07/2014  . Hepatitis C [B19.20] 12/07/2014  . Major depressive disorder, recurrent episode, severe, with psychotic behavior [F33.3] 12/06/2014     Continued Clinical Symptoms:  Alcohol Use Disorder Identification Test Final Score (AUDIT): 0 The "Alcohol Use Disorders Identification Test", Guidelines for Use in Primary Care, Second Edition.  World Pharmacologist [facility]). Score between 0-7:  no or low risk or alcohol related problems. Score between 8-15:  moderate risk of alcohol related problems. Score between 16-19:  high risk of alcohol related problems. Score 20 or above:  warrants further diagnostic evaluation for alcohol dependence and treatment.   CLINICAL FACTORS:   Alcohol/Substance Abuse/Dependencies Unstable or Poor Therapeutic Relationship Previous Psychiatric Diagnoses and Treatments   Musculoskeletal: Strength & Muscle Tone: within normal limits Gait & Station: normal Patient leans: N/A  Psychiatric Specialty Exam: Physical Exam  ROS  Blood pressure 108/72, pulse 88, temperature 97.7 F (36.5 C), temperature source Oral, resp. rate 20, height 6\' 5"  (1.956 m), weight 81.647 kg (180 lb), SpO2 100 %.Body mass index is 21.34 kg/(m^2).                  Please see H&P.                                        COGNITIVE FEATURES THAT  CONTRIBUTE TO RISK:  Closed-mindedness, Polarized thinking and Thought constriction (tunnel vision)    SUICIDE RISK:   Moderate:  Frequent suicidal ideation with limited intensity, and duration, some specificity in terms of plans, no associated intent, good self-control, limited dysphoria/symptomatology, some risk factors present, and identifiable protective factors, including available and accessible social support.  PLAN OF CARE: Please see H&P.   Medical Decision Making:  New problem, with additional work up planned, Review of Psycho-Social Stressors (1), Review or order clinical lab tests (1), Established Problem, Worsening (2), Review of Last Therapy Session (1), Review of Medication Regimen & Side Effects (2) and Review of New Medication or Change in Dosage (2)  I certify that inpatient services furnished can reasonably be expected to improve the patient's condition.   Sly Parlee MD 12/07/2014, 1:30 PM

## 2014-12-07 NOTE — Telephone Encounter (Signed)
I was called and informed that Ronnie Burton was found to be hepatitis C antibody positive and asked for further recommendations. The next step is to obtain a hepatitis C RNA PCR viral load to see if he has active hepatitis. If it is positive he can be referred for outpatient followup in our clinic. His HIV antibody is negative.

## 2014-12-07 NOTE — Progress Notes (Signed)
Patient ID: Ronnie Burton, male   DOB: [birth year], 54 y.o.   MRN: [id number] D: Client visible on unit in dayroom watching TV, reports depression "6" of 10, anxiety "7" of 10. 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 A: Writer provided emotional support, encouraged client to move forward in recovery. Reviewed medications, administered as prescribed. Staff will monitor q78min for safety. R: Client is safe on the unit, attended group.

## 2014-12-07 NOTE — Plan of Care (Signed)
Problem: Diagnosis: Increased Risk For Suicide Attempt Goal: STG-Patient Will Comply With Medication Regime Outcome: Progressing Pt took his medications as prescribed when offered. Denied adverse drug reactions at time of assessment. Encouraged to voice needs.

## 2014-12-07 NOTE — Progress Notes (Signed)
The focus of this group is to help patients review their daily goal of treatment and discuss progress on daily workbooks. Pt attended the evening group session and responded to all discussion prompts from the [place]. Pt shared that today was a good day on the unit, the highlights of which were connecting with his daughter on the phone and talking with his roommate throughout the day. Pt's only additional request from Nursing Staff this evening was for towels and soap, which were given to him following group. On the topic of coping skills, Ronnie Burton shared that he liked to play basketball and create music, both lifelong hobbies and coping skills. Pt's affect was appropriate and he volunteered several encouraging comments to his peers.

## 2014-12-07 NOTE — BHH Group Notes (Signed)
[place] Group Notes:  (Nursing/Recovery)  Date:  12/07/2014  Time:  0930 Type of Therapy:  Nurse Education  Participation Level:  Did Not Attend Summary of Progress/Problems: Pt did not attend scheduled groups this AM despite prompts /encouragement from nursing staff, stated "I'm tired and went back to sleep" [place] [employer] 12/07/2014, 0930

## 2014-12-07 NOTE — Telephone Encounter (Signed)
Lab result back (+) HCV Ab.  Pt was admitted to [facility].  Report given to [facility].

## 2014-12-08 DIAGNOSIS — B1920 Unspecified viral hepatitis C without hepatic coma: Secondary | ICD-10-CM

## 2014-12-08 LAB — TSH: TSH: 1.047 u[IU]/mL (ref 0.350–4.500)

## 2014-12-08 MED ORDER — GABAPENTIN 100 MG PO CAPS
200.0000 mg | ORAL_CAPSULE | Freq: Three times a day (TID) | ORAL | Status: DC
  Administered 2014-12-08 – 2014-12-13 (×11): 200 mg via ORAL
  Filled 2014-12-08: qty 84
  Filled 2014-12-08: qty 2
  Filled 2014-12-08: qty 84
  Filled 2014-12-08: qty 2
  Filled 2014-12-08: qty 84
  Filled 2014-12-08 (×2): qty 2
  Filled 2014-12-08 (×4): qty 84
  Filled 2014-12-08 (×3): qty 2
  Filled 2014-12-08 (×2): qty 84
  Filled 2014-12-08: qty 2
  Filled 2014-12-08 (×2): qty 84
  Filled 2014-12-08: qty 2
  Filled 2014-12-08: qty 84

## 2014-12-08 MED ORDER — LIDOCAINE 5 % EX PTCH
1.0000 | MEDICATED_PATCH | CUTANEOUS | Status: DC
  Administered 2014-12-08 – 2014-12-11 (×4): 1 via TRANSDERMAL
  Filled 2014-12-08 (×7): qty 1

## 2014-12-08 NOTE — BHH Group Notes (Signed)
[facility] Mental Health Association Group Therapy  12/08/2014 , 2:19 PM    Type of Therapy:  Mental Health Association Presentation  Participation Level:  Active  Participation Quality:  Attentive  Affect:  Blunted  Cognitive:  Oriented  Insight:  Limited  Engagement in Therapy:  Engaged  Modes of Intervention:  Discussion, Education and Socialization  Summary of Progress/Problems:  Shanon Brow from [place] came to present his recovery story and play the guitar.  Wandered in at the end of group.  Listened quietly to the last two pieces of music.  Roque Lias B 12/08/2014 , 2:19 PM

## 2014-12-08 NOTE — Progress Notes (Signed)
Spoke with Hospitalist MD and recommendations to follow regarding pos results on Hep panel.  HCV quant pending results.  Patient's dc orders to include follow up with GI Dept.

## 2014-12-08 NOTE — BHH Group Notes (Signed)
[facility] LCSW Aftercare Discharge Planning Group Note   12/08/2014 2:18 PM  Participation Quality:  Invited.  Chose to not attend.    Roque Lias B

## 2014-12-08 NOTE — Progress Notes (Signed)
Question about positive hep C ab Please obtain hep c viral load and LFT's May need GI and ID follow up depending on results  No need for official consult as per our discussion with NP  Leisa Lenz [facility] [phone]

## 2014-12-08 NOTE — Progress Notes (Signed)
[facility] MD Progress Note  12/08/2014 2:32 PM Ronnie Burton  MRN:  [id number] Subjective: Patient states " I am in pain.'  Objective: Pt is a 54 y.o. AA male, divorced, unemployed , who has a hx of schizophrenia vs Bipolar do as well as severe opioid use disorder, presented to to [facility] ED reporting symptoms of depression and command auditory hallucinations to kill himself. Pt per initial notes in EHR " reported he was discharged from [facility] in [place] two days ago after being admitted for cutting his wrist in a suicide attempt. Pt says he was discharged on 12/03/14 and given a bus pass to [place] with discharged instructions to go to [employer] and a list of behavior health resources in [place].  Patient seen and chart reviewed.Discussed patient with treatment team. Pt today seen in bed , appears to be withdrawn , but cooperative. Pt reports back pain issues, and feeling anxious about it. Pt otherwise denies any new complaints . Denies AH/VH/SI. Pt with very limited insight - states he was placed on a bus to come to [place] by his hospital for unknown reasons and he feels that his GF has something to do with it. His GF is still in [place] . Pt with newly pos Hep C ab.  Per staff - no disruptive issues noted on the unit.     Principal Problem: Schizoaffective disorder, bipolar type Diagnosis:   Patient Active Problem List   Diagnosis Date Noted  . Opioid use disorder, severe, dependence [F11.20] 12/07/2014  . Hepatitis C [B19.20] 12/07/2014  . Schizoaffective disorder, bipolar type [F25.0] 12/07/2014   Total Time spent with patient: 20 minutes   Past Medical History:  Past Medical History  Diagnosis Date  . Hypertension   . Schizophrenia    History reviewed. No pertinent past surgical history. Family History:  Family History  Problem Relation Age of Onset  . Alcoholism Father    Social History:  History  Alcohol Use No     History  Drug  Use No    Social History   Social History  . Marital Status: Single    Spouse Name: N/A  . Number of Children: N/A  . Years of Education: N/A   Social History Main Topics  . Smoking status: Current Every Day Smoker -- 0.50 packs/day    Types: Cigarettes  . Smokeless tobacco: Never Used  . Alcohol Use: No  . Drug Use: No  . Sexual Activity: Yes   Other Topics Concern  . None   Social History Narrative   Additional History:    Sleep: Fair  Appetite:  Fair     Musculoskeletal: Strength & Muscle Tone: within normal limits Gait & Station: normal Patient leans: N/A   Psychiatric Specialty Exam: Physical Exam  Review of Systems  Musculoskeletal: Positive for back pain.  Psychiatric/Behavioral: Positive for depression and substance abuse. The patient is nervous/anxious.   All other systems reviewed and are negative.   Blood pressure 116/70, pulse 93, temperature 97.9 F (36.6 C), temperature source Oral, resp. rate 20, height 6\' 5"  (1.956 m), weight 81.647 kg (180 lb), SpO2 100 %.Body mass index is 21.34 kg/(m^2).  General Appearance: Disheveled  Eye Contact::  Minimal  Speech:  Normal Rate  Volume:  Decreased  Mood:  Anxious  Affect:  Appropriate  Thought Process:  Coherent  Orientation:  Full (Time, Place, and Person)  Thought Content:  Rumination  Suicidal Thoughts:  No  Homicidal Thoughts:  No  Memory:  Immediate;   Fair Recent;   Fair Remote;   Fair  Judgement:  Impaired  Insight:  Lacking  Psychomotor Activity:  Decreased  Concentration:  Poor  Recall:  [employer] of Knowledge:Fair  Language: Fair  Akathisia:  No  Handed:  Right  AIMS (if indicated):     Assets:  Others:  access to health care  ADL's:  Intact  Cognition: WNL  Sleep:  Number of Hours: 6.75     Current Medications: Current Facility-Administered Medications  Medication Dose Route Frequency Provider Last Rate Last Dose  . acetaminophen (TYLENOL) tablet 650 mg  650 mg Oral Q6H  PRN Patrecia Pour, NP   650 mg at 12/06/14 2046  . alum & mag hydroxide-simeth (MAALOX/MYLANTA) 200-200-20 MG/5ML suspension 30 mL  30 mL Oral Q4H PRN Patrecia Pour, NP      . feeding supplement (ENSURE ENLIVE) (ENSURE ENLIVE) liquid 237 mL  237 mL Oral BID BM Jamere Stidham, MD   237 mL at 12/08/14 1141  . gabapentin (NEURONTIN) capsule 200 mg  200 mg Oral TID Ursula Alert, MD      . hydrOXYzine (ATARAX/VISTARIL) tablet 50 mg  50 mg Oral TID PRN Patrecia Pour, NP   50 mg at 12/07/14 1952  . ibuprofen (ADVIL,MOTRIN) tablet 600 mg  600 mg Oral Q8H PRN Patrecia Pour, NP      . lidocaine (LIDODERM) 5 % 1 patch  1 patch Transdermal Q24H Pearline Yerby, MD      . magnesium hydroxide (MILK OF MAGNESIA) suspension 30 mL  30 mL Oral Daily PRN Patrecia Pour, NP      . nicotine (NICODERM CQ - dosed in mg/24 hours) patch 21 mg  21 mg Transdermal Daily Patrecia Pour, NP   21 mg at 12/08/14 0800  . ondansetron (ZOFRAN) tablet 4 mg  4 mg Oral Q8H PRN Patrecia Pour, NP      . QUEtiapine (SEROQUEL) tablet 50 mg  50 mg Oral BID Patrecia Pour, NP   50 mg at 12/08/14 0804  . traZODone (DESYREL) tablet 100 mg  100 mg Oral QHS Patrecia Pour, NP   100 mg at 12/07/14 2107    Lab Results:  Results for orders placed or performed during the hospital encounter of 12/06/14 (from the past 48 hour(s))  TSH     Status: None   Collection Time: 12/08/14  6:55 AM  Result Value Ref Range   TSH 1.047 0.350 - 4.500 uIU/mL    Comment: Performed at [facility]    Physical Findings: AIMS: Facial and Oral Movements Muscles of Facial Expression: None, normal Lips and Perioral Area: None, normal Jaw: None, normal Tongue: None, normal,Extremity Movements Upper (arms, wrists, hands, fingers): None, normal Lower (legs, knees, ankles, toes): None, normal, Trunk Movements Neck, shoulders, hips: None, normal, Overall Severity Severity of abnormal movements (highest score from questions above): None,  normal Incapacitation due to abnormal movements: None, normal Patient's awareness of abnormal movements (rate only patient's report): No Awareness, Dental Status Current problems with teeth and/or dentures?: Yes (multiple missing teeth)  CIWA:    COWS:     Assessment: Pt is a 54 y.o. AA male, divorced, unemployed , who has a hx of schizophrenia vs Bipolar do as well as severe opioid use disorder, presented to to [facility] ED reporting symptoms of depression and command auditory hallucinations to kill himself. Pt with limited insight in to his  illness, stays withdrawn , has been compliant on his medications. Will continue treatment.   Treatment Plan Summary: Daily contact with patient to assess and evaluate symptoms and progress in treatment and Medication management Will continue Seroquel 50 mg po bid as scheduled for psychosis. Will increase Gabapentin to 200 mg po tid for mood sx,tremors, pain. Will continue trazodone 100 mg po qhs for sleep. Will make available Vistaril 25 mg po tid prn for anxiety sx. Add Lidocaine patch for pain.  Will continue to monitor vitals ,medication compliance and treatment side effects while patient is here.  Will monitor for medical issues as well as call consult as needed.  Reviewed labs ,cmp reviewed- AST/ALT elevated - Hep C ab positive - will get Hospitalist consult for further recommendations.  TSH reviewed- wnl. CSW will start working on disposition. Will get collateral info from [place]. Patient to participate in therapeutic milieu .      Medical Decision Making:  Review of Psycho-Social Stressors (1), Review or order clinical lab tests (1), Decision to obtain old records (1), Review of Last Therapy Session (1), Review of Medication Regimen & Side Effects (2) and Review of New Medication or Change in Dosage (2)     Satrina Magallanes MD 12/08/2014, 2:32 PM

## 2014-12-08 NOTE — Progress Notes (Addendum)
D:  Pt flat/depressed during interaction, denies SI/HI/AVH, slept poor last night, appetite fair, concentration good, energy level low, and pt is drinking ensures between meals, contracts for safety, interacts appropriately, rates depression at a 5 out of 10, rates hopelessness at a 3 out of 10, rates anxiety at an 8 out of 10, goal today: "to work on getting the help I need"  A:  Emotional support provided, Encouraged pt to continue with treatment plan and attend all group activities, q15 min checks maintained for safety.  R:  Pt is receptive, going to some groups, needs some encouragement to go to groups and come out of room, pleasant and cooperative with staff and other patients on the unit.

## 2014-12-08 NOTE — BHH Suicide Risk Assessment (Signed)
[facility] INPATIENT:  Family/Significant Other Suicide Prevention Education  Suicide Prevention Education:  Education Completed; Daughter Metro Kung [phone],  (name of family member/significant other) has been identified by the patient as the family member/significant other with whom the patient will be residing, and identified as the person(s) who will aid the patient in the event of a mental health crisis (suicidal ideations/suicide attempt).  With written consent from the patient, the family member/significant other has been provided the following suicide prevention education, prior to the and/or following the discharge of the patient.  The suicide prevention education provided includes the following:  Suicide risk factors  Suicide prevention and interventions  National Suicide Hotline telephone number  [facility] assessment telephone number  [facility] Emergency Assistance [place] and/or Residential Mobile Crisis Unit telephone number  Request made of family/significant other to:  Remove weapons (e.g., guns, rifles, knives), all items previously/currently identified as safety concern.    Remove drugs/medications (over-the-counter, prescriptions, illicit drugs), all items previously/currently identified as a safety concern.  The family member/significant other verbalizes understanding of the suicide prevention education information provided.  The family member/significant other agrees to remove the items of safety concern listed above.  Ronnie Burton, Casimiro Needle 12/08/2014, 3:30 PM

## 2014-12-08 NOTE — Progress Notes (Signed)
Pt states at 2100 he slipped on water in his room unwitnessed and caught himself halfway on his bed and the floor. RN noted that patient did not have his non-slip socks on, but had his socks from home on without shoes. Pt states he does "not like the hospital socks". RN informed pt that he had to wear them on the unit while ambulating. Pt verbally agrees; Therapist, sports retrieved his non-slip sock that were on patient's shelf in his room, which were given to him to wear. Vital signs taken and WNL; no s/s of distress noted. Pt resting in bed and states his right ankle is sore. No swelling of ankle noted on assessment. Respirations regular and unlabored. Glenda Chroman, NP notified at 2101; [employer] RN notified at 2103, Tori [facility] notified at 2103. Cori, NP in to assess patient in his room at 2120. Pt denies hitting his head or hurting any other areas. No new orders received from provider.

## 2014-12-08 NOTE — Progress Notes (Signed)
Did not attend group 

## 2014-12-09 ENCOUNTER — Inpatient Hospital Stay (HOSPITAL_COMMUNITY): Payer: Federal, State, Local not specified - Other

## 2014-12-09 DIAGNOSIS — W19XXXA Unspecified fall, initial encounter: Secondary | ICD-10-CM

## 2014-12-09 MED ORDER — IBUPROFEN 800 MG PO TABS: 800.0000 mg | ORAL_TABLET | Freq: Three times a day (TID) | ORAL | Status: DC

## 2014-12-09 MED ORDER — ACETAMINOPHEN 325 MG PO TABS
650.0000 mg | ORAL_TABLET | Freq: Once | ORAL | Status: AC
  Administered 2014-12-09: 650 mg via ORAL
  Filled 2014-12-09: qty 2

## 2014-12-09 NOTE — Progress Notes (Signed)
D: Patient with dull, flat affect but brightens on approach. Pt declined to go to karaoke group this evening, but instead remained in the group room watching television.  A: Q 15 minute safety checks, reorient as needed, encourage staff/peer interaction, medication compliance and group participation. R: Patient denies SI/HI at this time. Pt compliant with medications this shift.

## 2014-12-09 NOTE — Progress Notes (Signed)
Pt has been complaining of rt shoulder, wrist, knee and ankle pain today from falling on night shift last night, pt states that he is unable to get out of bed and ambulate today due to the pain, MD notified, new order given for patient to go to ED for evaluation, AC and Charge RN notified, [facility] called for transportation.

## 2014-12-09 NOTE — Progress Notes (Signed)
D: Patient withdrawn and with dull, flat affect. Pt denies SI or plans to harm himself this shift. Pt with minimal interaction with peers in the milieu. A: Q 15 minute safety checks, encourage staff/peer interactions, administer medications as ordered by MD. Encourage group session participation. R: Pt compliant with HS medications. No distress noted.

## 2014-12-09 NOTE — Progress Notes (Signed)
54 yr old medicaid of [place] pt who states he just moved to [place] on 12/08/14 and has not been to DSS nor SSI to change medicaid services  CM spoke with pt to discuss the importance of going to DSS/SSI or online to reapply for [place] medicaid to get assist with getting a pcp.  Explained if coverage not changed he may be considered as uninsured in [place] eventually CM discussed and provided written information for  accepting pcps, discussed the importance of pcp vs EDP services for f/u care, www.needymeds.org, www.goodrx.com, discounted pharmacies and other [employer] such as [employer], affordable care act, financial assistance, uninsured dental services, [place] med assist, DSS and  health department  Reviewed resources for [employer] uninsured accepting pcps like [facility], family medicine at [employer], [facility], [facility], local urgent care centers, [facility], [facility] family practice, general medical clinics, family services of the [place], [facility] urgent care plus others, medication resources, [facility] out patient pharmacies and housing Pt voiced understanding and appreciation of resources provided   Provided [facility] contact information Pt informed he is not eligible at this time for [facility] but may assist as transitional medical care resources if he does not get assist from DSS Pt provided with a list of [place] providers to assist him

## 2014-12-09 NOTE — Progress Notes (Signed)
D:  Pt returns from [facility], Per pt self inventory pt reports sleeping poor, appetite fair, energy level low, ability to pay attention good, rates depression at a 5 out of 10, hopelessness at a 5 out of 10, anxiety at an 8 out of 10, denies HI/AVH, endorses SI on and off, contracts for safety, flat/depressed during interaction, goal today: "Take my meds and get myself together"    A:  Emotional support provided, Encouraged pt to continue with treatment plan and attend all group activities, q15 min checks maintained for safety.  R:  Pt is not going to groups and has been staying in his bed today while on the unit, pt went to [facility] this am for evaluation after falling last night on night shift, pt returned with ace wraps to rt ankle and rt wrist.

## 2014-12-09 NOTE — BHH Group Notes (Signed)
Adult Psychoeducational Group Note  Date:  12/09/2014 Time:  0900am  Group Topic/Focus:  Goals Group:   The focus of this group is to help patients establish daily goals to achieve during treatment and discuss how the patient can incorporate goal setting into their daily lives to aide in recovery. Orientation:   The focus of this group is to educate the patient on the purpose and policies of crisis stabilization and provide a format to answer questions about their admission.  The group details unit policies and expectations of patients while admitted.  Participation Level:  Did Not Attend   Ronnie Burton 12/09/2014, 1:29 PM

## 2014-12-09 NOTE — ED Notes (Addendum)
Pt brought over by [facility] after a slip and fall last night.  C/o R ankle, R hip, R hand, and R wrist pain.  Pain score 7/10.  No deformities noted.

## 2014-12-09 NOTE — Progress Notes (Signed)
[facility] MD Progress Note  12/09/2014 3:19 PM Ronnie Burton  MRN:  [id number] Subjective: Patient states " I slipped and fell and I am in pain."  Objective: Pt is a 54 y.o. AA male, divorced, unemployed , who has a hx of schizophrenia vs Bipolar do as well as severe opioid use disorder, presented to to [facility] ED reporting symptoms of depression and command auditory hallucinations to kill himself. Pt per initial notes in EHR " reported he was discharged from [facility] in [place] two days ago after being admitted for cutting his wrist in a suicide attempt. Pt says he was discharged on 12/03/14 and given a bus pass to [place] with discharged instructions to go to [employer] and a list of behavior health resources [place].  Patient seen and chart reviewed.Discussed patient with treatment team. Pt today seen in bed , appears to be in excruciating pain. Pt reports that he slipped and fell last night hitting his right side against the floor. Pt on exam - has limited ROM of his right shoulder , with tenderness - around his shoulder, more so on the axillary aspect . No swelling noted . Pt also reports ROM of his right LE as well as his Rt.side knee joint and hip joint. Has gait instability - limited weight bearing on his right side when asked to walk. Pt reports feeling extremely anxious and depressed about his situation. Discussed with pt that he will be send to ED for further evaluation. Pt reports improved AH , denies SI.  Per staff - pt reported a fall last night - unwitnessed - has been refusing to get out of bed this AM 2/2 pain.   Principal Problem: Schizoaffective disorder, bipolar type Diagnosis:   Patient Active Problem List   Diagnosis Date Noted  . Opioid use disorder, severe, dependence [F11.20] 12/07/2014  . Hepatitis C [B19.20] 12/07/2014  . Schizoaffective disorder, bipolar type [F25.0] 12/07/2014   Total Time spent with patient: 30  minutes   Past Medical History:  Past Medical History  Diagnosis Date  . Hypertension   . Schizophrenia    History reviewed. No pertinent past surgical history. Family History:  Family History  Problem Relation Age of Onset  . Alcoholism Father    Social History:  History  Alcohol Use No     History  Drug Use No    Social History   Social History  . Marital Status: Single    Spouse Name: N/A  . Number of Children: N/A  . Years of Education: N/A   Social History Main Topics  . Smoking status: Current Every Day Smoker -- 0.50 packs/day    Types: Cigarettes  . Smokeless tobacco: Never Used  . Alcohol Use: No  . Drug Use: No  . Sexual Activity: Yes   Other Topics Concern  . None   Social History Narrative   Additional History:    Sleep: Poor 2/2 pain  Appetite:  Poor 2/2 pain     Musculoskeletal: Strength & Muscle Tone: within normal limits Gait & Station: normal Patient leans: N/A   Psychiatric Specialty Exam: Physical Exam  Musculoskeletal:       Right shoulder: He exhibits decreased range of motion and tenderness.       Left shoulder: Normal.       Right elbow: Normal.      Left elbow: Normal.       Right wrist: Normal.       Left  wrist: Normal.       Right hip: He exhibits decreased range of motion.       Left hip: Normal.       Right knee: He exhibits decreased range of motion. Tenderness found.       Left knee: Normal.       Right ankle: Tenderness.       Left ankle: Normal.    Review of Systems  Musculoskeletal: Positive for myalgias, back pain, joint pain and falls.  Psychiatric/Behavioral: Positive for depression and substance abuse. The patient is nervous/anxious.   All other systems reviewed and are negative.   Blood pressure 126/65, pulse 79, temperature 97.7 F (36.5 C), temperature source Oral, resp. rate 16, height 6\' 5"  (1.956 m), weight 81.647 kg (180 lb), SpO2 100 %.Body mass index is 21.34 kg/(m^2).  General Appearance:  Disheveled  Eye Contact::  Minimal  Speech:  Normal Rate  Volume:  Decreased  Mood:  Anxious and Depressed  Affect:  Depressed  Thought Process:  Coherent  Orientation:  Full (Time, Place, and Person)  Thought Content:  Hallucinations: Auditory and Rumination improving  Suicidal Thoughts:  No  Homicidal Thoughts:  No  Memory:  Immediate;   Fair Recent;   Fair Remote;   Fair  Judgement:  Impaired  Insight:  Lacking  Psychomotor Activity:  Decreased  Concentration:  Poor  Recall:  [employer] of Knowledge:Fair  Language: Fair  Akathisia:  No  Handed:  Right  AIMS (if indicated):     Assets:  Others:  access to health care  ADL's:  Intact  Cognition: WNL  Sleep:  Number of Hours: 6.75     Current Medications: Current Facility-Administered Medications  Medication Dose Route Frequency Provider Last Rate Last Dose  . acetaminophen (TYLENOL) tablet 650 mg  650 mg Oral Q6H PRN Patrecia Pour, NP   650 mg at 12/09/14 0753  . alum & mag hydroxide-simeth (MAALOX/MYLANTA) 200-200-20 MG/5ML suspension 30 mL  30 mL Oral Q4H PRN Patrecia Pour, NP      . feeding supplement (ENSURE ENLIVE) (ENSURE ENLIVE) liquid 237 mL  237 mL Oral BID BM Dyana Magner, MD   237 mL at 12/09/14 1410  . gabapentin (NEURONTIN) capsule 200 mg  200 mg Oral TID Ursula Alert, MD   200 mg at 12/09/14 0754  . hydrOXYzine (ATARAX/VISTARIL) tablet 50 mg  50 mg Oral TID PRN Patrecia Pour, NP   50 mg at 12/07/14 1952  . ibuprofen (ADVIL,MOTRIN) tablet 600 mg  600 mg Oral Q8H PRN Patrecia Pour, NP   600 mg at 12/09/14 0754  . lidocaine (LIDODERM) 5 % 1 patch  1 patch Transdermal Q24H Ursula Alert, MD   1 patch at 12/08/14 1822  . magnesium hydroxide (MILK OF MAGNESIA) suspension 30 mL  30 mL Oral Daily PRN Patrecia Pour, NP      . nicotine (NICODERM CQ - dosed in mg/24 hours) patch 21 mg  21 mg Transdermal Daily Patrecia Pour, NP   21 mg at 12/09/14 0754  . ondansetron (ZOFRAN) tablet 4 mg  4 mg Oral Q8H PRN  Patrecia Pour, NP      . QUEtiapine (SEROQUEL) tablet 50 mg  50 mg Oral BID Patrecia Pour, NP   50 mg at 12/09/14 0754  . traZODone (DESYREL) tablet 100 mg  100 mg Oral QHS Patrecia Pour, NP   100 mg at 12/08/14 2132   Current Outpatient Prescriptions  Medication Sig Dispense Refill  . haloperidol (HALDOL) 5 MG tablet Take 5 mg by mouth 2 (two) times daily.    . hydrOXYzine (ATARAX/VISTARIL) 50 MG tablet Take 50 mg by mouth 3 (three) times daily as needed for anxiety.    Marland Kitchen ibuprofen (ADVIL,MOTRIN) 800 MG tablet Take 1 tablet (800 mg total) by mouth 3 (three) times daily. 21 tablet 0  . QUEtiapine (SEROQUEL) 50 MG tablet Take 50 mg by mouth every morning.    . traZODone (DESYREL) 100 MG tablet Take 100 mg by mouth at bedtime.      Lab Results:  Results for orders placed or performed during the hospital encounter of 12/06/14 (from the past 48 hour(s))  TSH     Status: None   Collection Time: 12/08/14  6:55 AM  Result Value Ref Range   TSH 1.047 0.350 - 4.500 uIU/mL    Comment: Performed at [facility]    Physical Findings: AIMS: Facial and Oral Movements Muscles of Facial Expression: None, normal Lips and Perioral Area: None, normal Jaw: None, normal Tongue: None, normal,Extremity Movements Upper (arms, wrists, hands, fingers): None, normal Lower (legs, knees, ankles, toes): None, normal, Trunk Movements Neck, shoulders, hips: None, normal, Overall Severity Severity of abnormal movements (highest score from questions above): None, normal Incapacitation due to abnormal movements: None, normal Patient's awareness of abnormal movements (rate only patient's report): No Awareness, Dental Status Current problems with teeth and/or dentures?: Yes (multiple missing teeth)  CIWA:    COWS:     Assessment: Pt is a 54 y.o. AA male, divorced, unemployed , who has a hx of schizophrenia vs Bipolar do as well as severe opioid use disorder, presented to to [facility] ED  reporting symptoms of depression and command auditory hallucinations to kill himself. Pt with limited insight in to his illness, stays withdrawn , has been compliant on his medications.  Discussed with patient that he will be send to ED for evaluation of fall. Will continue treatment.   Treatment Plan Summary: Daily contact with patient to assess and evaluate symptoms and progress in treatment and Medication management  Patient to be send to ED for evaluation - S/P Fall .  Will continue Seroquel 50 mg po bid as scheduled for psychosis. Increased Gabapentin to 200 mg po tid for mood sx,tremors, pain. Will continue trazodone 100 mg po qhs for sleep. Will make available Vistaril 25 mg po tid prn for anxiety sx. Add Lidocaine patch for pain.Ice pack as needed.  Will continue to monitor vitals ,medication compliance and treatment side effects while patient is here.  Will monitor for medical issues as well as call consult as needed.  Reviewed labs ,cmp reviewed- AST/ALT elevated - Hep C ab positive - as per Hospitalist consult - pt to have HCV RNA quant test- no further recommendations at this time. CSW will start working on disposition. Will get collateral info from [facility]. Patient to participate in therapeutic milieu .      Medical Decision Making:  Review of Psycho-Social Stressors (1), Review or order clinical lab tests (1), Decision to obtain old records (1), Review of Last Therapy Session (1), Review of Medication Regimen & Side Effects (2) and Review of New Medication or Change in Dosage (2)     Ovidio Steele MD 12/09/2014, 3:19 PM

## 2014-12-09 NOTE — [facility] Group Notes (Signed)
[facility] Group Notes:  (Counselor/Nursing/MHT/Case Management/Adjunct)  12/09/2014 1:15PM  Type of Therapy:  Group Therapy  Participation Level:  Pt not here at the beginning of group.  Seen in hall when group was in session.  Did not join [place].  Summary of Progress/Problems: The topic for group was balance in life.  Pt participated in the discussion about when their life was in balance and out of balance and how this feels.  Pt discussed ways to get back in balance and short term goals they can work on to get where they want to be.    Trish Mage 12/09/2014 3:48 PM

## 2014-12-09 NOTE — Progress Notes (Signed)
Pt got out of bed and was able to dress himself, wheelchair provided to take pt to transportation, transported to [facility] via [facility] transportation, MHT went with patient to ED and will stay with pt in ED.

## 2014-12-09 NOTE — ED Notes (Signed)
Pt and sitter not in lobby-[facility] called 30 minutes ago for transport to [facility]

## 2014-12-09 NOTE — Discharge Instructions (Signed)
Fall Prevention in Hospitals Take ibuprofen for pain.  As a hospital patient, your condition and the treatments you receive can increase your risk for falls. Some additional risk factors for falls in a hospital include:  Being in an unfamiliar environment.  Being on bed rest.  Your surgery.  Taking certain medicines.  Your tubing requirements, such as intravenous (IV) therapy or catheters. It is important that you learn how to decrease fall risks while at the hospital. Below are important tips that can help prevent falls. SAFETY TIPS FOR PREVENTING FALLS Talk about your risk of falling.  Ask your caregiver why you are at risk for falling. Is it your medicine, illness, tubing placement, or something else?  Make a plan with your caregiver to keep you safe from falls.  Ask your caregiver or pharmacist about side effect of your medicines. Some medicines can make you dizzy or affect your coordination. Ask for help.  Ask for help before getting out of bed. You may need to press your call button.  Ask for assistance in getting you safely to the toilet.  Ask for a walker or cane to be put at your bedside. Ask that most of the side rails on your bed be placed up before your caregiver leaves the room.  Ask family or friends to sit with you.  Ask for things that are out of your reach, such as your glasses, hearing aids, telephone, bedside table, or call button. Follow these tips to avoid falling:  Stay lying or seated, rather than standing, while waiting for help.  Wear rubber-soled slippers or shoes whenever you walk in the hospital.  Avoid quick, sudden movements.  Change positions slowly.  Sit on the side of your bed before standing.  Stand up slowly and wait before you start to walk.  Let your caregiver know if there is a spill on the floor.  Pay careful attention to the medical equipment, electrical cords, and tubes around you.  When you need help, use your call button by  your bed or in the bathroom. Wait for one of your caregivers to help you.  If you feel dizzy or unsure of your footing, return to bed and wait for assistance.  Avoid being distracted by the TV, telephone, or another person in your room.  Do not lean or support yourself on rolling objects, such as IV poles or bedside tables. Document Released: 04/06/2000 Document Revised: 03/26/2012 Document Reviewed: 12/16/2011 [facility] Patient Information 2015 [place], [place]. This information is not intended to replace advice given to you by your health care provider. Make sure you discuss any questions you have with your health care provider.

## 2014-12-09 NOTE — ED Provider Notes (Signed)
CSN: [id number]     Arrival date & time 12/09/14  1048 History   First MD Initiated Contact with Patient 12/09/14 1107     Chief Complaint  Patient presents with  . Fall  . Wrist Pain     (Consider location/radiation/quality/duration/timing/severity/associated sxs/prior Treatment) The history is provided by the patient. No language interpreter was used.  Ronnie Burton is a 54 y.o male who was brought over by [facility] after a slip and fall last night. He is complaining of entire right side of his body being in pain. He states after the fall he was able to get up and ambulate. He denies taking anything for pain prior to arrival. He denies any numbness or tingling. He denies any head injury or loss of consciousness.  Past Medical History  Diagnosis Date  . Hypertension   . Schizophrenia    History reviewed. No pertinent past surgical history. Family History  Problem Relation Age of Onset  . Alcoholism Father    Social History  Substance Use Topics  . Smoking status: Current Every Day Smoker -- 0.50 packs/day    Types: Cigarettes  . Smokeless tobacco: Never Used  . Alcohol Use: No    Review of Systems  Skin: Negative for color change and wound.  Neurological: Negative for syncope and weakness.      Allergies  Review of patient's allergies indicates no known allergies.  Home Medications   Prior to Admission medications   Medication Sig Start Date End Date Taking? Authorizing Provider  haloperidol (HALDOL) 5 MG tablet Take 5 mg by mouth 2 (two) times daily.    Historical Provider, MD  hydrOXYzine (ATARAX/VISTARIL) 50 MG tablet Take 50 mg by mouth 3 (three) times daily as needed for anxiety.    Historical Provider, MD  ibuprofen (ADVIL,MOTRIN) 800 MG tablet Take 1 tablet (800 mg total) by mouth 3 (three) times daily. 12/09/14   Morad Tal Patel-Mills, PA-C  QUEtiapine (SEROQUEL) 50 MG tablet Take 50 mg by mouth every morning.    Historical Provider, MD  traZODone (DESYREL) 100 MG tablet  Take 100 mg by mouth at bedtime.    Historical Provider, MD   BP 126/65 mmHg  Pulse 79  Temp(Src) 97.7 F (36.5 C) (Oral)  Resp 16  Ht 6\' 5"  (1.956 m)  Wt 180 lb (81.647 kg)  BMI 21.34 kg/m2  SpO2 100% Physical Exam  Constitutional: He is oriented to person, place, and time. He appears well-developed and well-nourished.  HENT:  Head: Normocephalic and atraumatic.  Eyes: Conjunctivae are normal.  Neck: Normal range of motion. Neck supple.  Cardiovascular: Normal rate.   Pulmonary/Chest: Effort normal.  No contusion or ecchymosis.  No flail chest. No shortness of breath.  Abdominal: Soft. Normal appearance. He exhibits no distension. There is no tenderness.  Soft. Nontender  Musculoskeletal: Normal range of motion.  Able to flex and extend the right knee and toes. Able to dorsi and plantar flex the right ankle.  2+ DP pulse. NVI.   No ecchymosis or deformity of the right wrist.  Tenderness to palpation of the wrist with snuff box tenderness.  Able to flex and extend fingers.  2+Radial pulse. NVI.  Able to abduct and adduct the right shoulder. No clavical deformity.   Pelvis is stable.   Neurological: He is alert and oriented to person, place, and time.  Skin: Skin is warm and dry.  Psychiatric: He has a normal mood and affect. His behavior is normal.    ED Course  Procedures (including critical care time) Labs Review Labs Reviewed  TSH  HCV RNA QUANT    Imaging Review Dg Wrist Complete Right  12/09/2014   CLINICAL DATA:  Status post fall today. Right wrist pain. Initial encounter.  EXAM: RIGHT WRIST - COMPLETE 3+ VIEW  COMPARISON:  None.  FINDINGS: No acute bony or joint abnormality is identified. Mild appearing degenerative change is seen at the first [facility] joint. Soft tissues are unremarkable.  IMPRESSION: No acute abnormality.  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   Electronically Signed   By: Inge Rise M.D.   On: 12/09/2014 12:16   I have personally  reviewed and evaluated these images and lab results as part of my medical decision-making.   EKG Interpretation None      MDM   Final diagnoses:  Fall, initial encounter   Patient presents from behavioral health after slip and fall. He has no bony deformity and is ambulatory. Xray of right wrist is negative for acute fracture or dislocation.  His right wrist and ankle were wrapped in ace bandage. He was given a prescription for ibuprofen. He can follow up with his pcp after discharge from [facility]    Ottie Glazier, PA-C 12/09/14 1243  Lacretia Leigh, MD 12/10/14 [phone]

## 2014-12-10 LAB — HCV RNA QUANT
HCV Quantitative Log: 5.97 log10 IU/mL (ref >1.70)
HCV Quantitative: 934000 IU/mL (ref >50)

## 2014-12-10 MED ORDER — TRAZODONE HCL 150 MG PO TABS: 150.0000 mg | ORAL_TABLET | Freq: Every day | ORAL | Status: DC

## 2014-12-10 MED ORDER — TRAZODONE HCL 150 MG PO TABS
150.0000 mg | ORAL_TABLET | Freq: Every day | ORAL | Status: DC
  Filled 2014-12-10: qty 1
  Filled 2014-12-10: qty 14

## 2014-12-10 MED ORDER — QUETIAPINE FUMARATE 50 MG PO TABS
50.0000 mg | ORAL_TABLET | Freq: Every morning | ORAL | Status: DC
  Filled 2014-12-10 (×3): qty 1

## 2014-12-10 MED ORDER — HYDROXYZINE HCL 50 MG PO TABS: 50.0000 mg | ORAL_TABLET | Freq: Three times a day (TID) | ORAL | Status: DC | PRN

## 2014-12-10 MED ORDER — TRAZODONE HCL 100 MG PO TABS
100.0000 mg | ORAL_TABLET | Freq: Every day | ORAL | Status: DC
  Administered 2014-12-10: 100 mg via ORAL
  Filled 2014-12-10 (×3): qty 1

## 2014-12-10 MED ORDER — QUETIAPINE FUMARATE 50 MG PO TABS: 50.0000 mg | ORAL_TABLET | Freq: Two times a day (BID) | ORAL | Status: DC

## 2014-12-10 MED ORDER — QUETIAPINE FUMARATE 50 MG PO TABS
75.0000 mg | ORAL_TABLET | Freq: Every day | ORAL | Status: DC
  Administered 2014-12-10: 75 mg via ORAL
  Filled 2014-12-10 (×3): qty 1

## 2014-12-10 MED ORDER — GABAPENTIN 100 MG PO CAPS: 200.0000 mg | ORAL_CAPSULE | Freq: Three times a day (TID) | ORAL | Status: DC

## 2014-12-10 NOTE — Progress Notes (Signed)
[facility] MD Progress Note  12/10/2014 2:58 PM Ronnie Burton  MRN:  [id number] Subjective: Patient states " I am sore and I am not sleeping.'   Objective: Pt is a 54 y.o. AA male, divorced, unemployed , who has a hx of schizophrenia vs Bipolar do as well as severe opioid use disorder, presented to to [facility] ED reporting symptoms of depression and command auditory hallucinations to kill himself. Pt per initial notes in EHR " reported he was discharged from [facility] in [place] two days ago after being admitted for cutting his wrist in a suicide attempt. Pt says he was discharged on 12/03/14 and given a bus pass to [place] with discharged instructions to go to [employer] and a list of behavior health resources [place].  Patient seen and chart reviewed.Discussed patient with treatment team. Pt today seen in bed , appears to be depressed, avoids eye contact.States he is sore all over from the fall yesterday. Pt reports sleep issues at night. He also reports having on and off AH- unable to elaborate . Pt had a fall yday and was send to ED for evaluation- pt today continues to report pain in his wrist as well as right side . Pt had xrays done which were normal.  Per staff - pt continues be symptomatic - is in pain , has AH and has sleep issues.     Principal Problem: Schizoaffective disorder, bipolar type Diagnosis:   Patient Active Problem List   Diagnosis Date Noted  . Opioid use disorder, severe, dependence [F11.20] 12/07/2014  . Hepatitis C [B19.20] 12/07/2014  . Schizoaffective disorder, bipolar type [F25.0] 12/07/2014   Total Time spent with patient: 20 minutes   Past Medical History:  Past Medical History  Diagnosis Date  . Hypertension   . Schizophrenia    History reviewed. No pertinent past surgical history. Family History:  Family History  Problem Relation Age of Onset  . Alcoholism Father    Social History:  History  Alcohol  Use No     History  Drug Use No    Social History   Social History  . Marital Status: Single    Spouse Name: N/A  . Number of Children: N/A  . Years of Education: N/A   Social History Main Topics  . Smoking status: Current Every Day Smoker -- 0.50 packs/day    Types: Cigarettes  . Smokeless tobacco: Never Used  . Alcohol Use: No  . Drug Use: No  . Sexual Activity: Yes   Other Topics Concern  . None   Social History Narrative   Additional History:    Sleep: Poor   Appetite:  Fair      Musculoskeletal: Strength & Muscle Tone: within normal limits Gait & Station: normal Patient leans: N/A   Psychiatric Specialty Exam: Physical Exam  Musculoskeletal:       Right shoulder: Normal. He exhibits normal range of motion and no tenderness.       Left shoulder: Normal.       Right elbow: Normal.      Left elbow: Normal.       Right wrist: Normal.       Left wrist: Normal.       Right hip: Normal. He exhibits normal range of motion and normal strength.       Left hip: Normal.       Right knee: Normal. He exhibits normal range of motion and no swelling. No tenderness found.  Left knee: Normal.       Right ankle: Normal. No tenderness.       Left ankle: Normal.    Review of Systems  Musculoskeletal: Positive for myalgias and back pain. Negative for joint pain and falls.  Psychiatric/Behavioral: Positive for depression and substance abuse. The patient is nervous/anxious.   All other systems reviewed and are negative.   Blood pressure 115/55, pulse 61, temperature 98 F (36.7 C), temperature source Oral, resp. rate 18, height 6\' 5"  (1.956 m), weight 81.647 kg (180 lb), SpO2 100 %.Body mass index is 21.34 kg/(m^2).  General Appearance: Disheveled  Eye Contact::  Minimal  Speech:  Normal Rate  Volume:  Decreased  Mood:  Anxious and Depressed  Affect:  Depressed  Thought Process:  Coherent  Orientation:  Full (Time, Place, and Person)  Thought Content:   Hallucinations: Auditory and Rumination   Suicidal Thoughts:  No  Homicidal Thoughts:  No  Memory:  Immediate;   Fair Recent;   Fair Remote;   Fair  Judgement:  Impaired  Insight:  Lacking  Psychomotor Activity:  Decreased  Concentration:  Poor  Recall:  [employer] of Knowledge:Fair  Language: Fair  Akathisia:  No  Handed:  Right  AIMS (if indicated):     Assets:  Others:  access to health care  ADL's:  Intact  Cognition: WNL  Sleep:  Number of Hours: 6.5     Current Medications: Current Facility-Administered Medications  Medication Dose Route Frequency Provider Last Rate Last Dose  . acetaminophen (TYLENOL) tablet 650 mg  650 mg Oral Q6H PRN Patrecia Pour, NP   650 mg at 12/09/14 2123  . alum & mag hydroxide-simeth (MAALOX/MYLANTA) 200-200-20 MG/5ML suspension 30 mL  30 mL Oral Q4H PRN Patrecia Pour, NP      . feeding supplement (ENSURE ENLIVE) (ENSURE ENLIVE) liquid 237 mL  237 mL Oral BID BM Shanikia Kernodle, MD   237 mL at 12/10/14 1438  . gabapentin (NEURONTIN) capsule 200 mg  200 mg Oral TID Ursula Alert, MD   200 mg at 12/10/14 1437  . hydrOXYzine (ATARAX/VISTARIL) tablet 50 mg  50 mg Oral TID PRN Patrecia Pour, NP   50 mg at 12/07/14 1952  . ibuprofen (ADVIL,MOTRIN) tablet 600 mg  600 mg Oral Q8H PRN Patrecia Pour, NP   600 mg at 12/09/14 2123  . lidocaine (LIDODERM) 5 % 1 patch  1 patch Transdermal Q24H Ursula Alert, MD   1 patch at 12/09/14 1630  . magnesium hydroxide (MILK OF MAGNESIA) suspension 30 mL  30 mL Oral Daily PRN Patrecia Pour, NP      . nicotine (NICODERM CQ - dosed in mg/24 hours) patch 21 mg  21 mg Transdermal Daily Patrecia Pour, NP   21 mg at 12/10/14 0941  . ondansetron (ZOFRAN) tablet 4 mg  4 mg Oral Q8H PRN Patrecia Pour, NP      . QUEtiapine (SEROQUEL) tablet 50 mg  50 mg Oral BID Patrecia Pour, NP   50 mg at 12/10/14 0939  . traZODone (DESYREL) tablet 150 mg  150 mg Oral QHS Ursula Alert, MD        Lab Results:  No results found for  this or any previous visit (from the past 48 hour(s)).  Physical Findings: AIMS: Facial and Oral Movements Muscles of Facial Expression: None, normal Lips and Perioral Area: None, normal Jaw: None, normal Tongue: None, normal,Extremity Movements Upper (arms, wrists, hands, fingers):  None, normal Lower (legs, knees, ankles, toes): None, normal, Trunk Movements Neck, shoulders, hips: None, normal, Overall Severity Severity of abnormal movements (highest score from questions above): None, normal Incapacitation due to abnormal movements: None, normal Patient's awareness of abnormal movements (rate only patient's report): No Awareness, Dental Status Current problems with teeth and/or dentures?: Yes (missing teeth)  CIWA:    COWS:     Assessment: Pt is a 54 y.o. AA male, divorced, unemployed , who has a hx of schizophrenia vs Bipolar do as well as severe opioid use disorder, presented to to [facility] ED reporting symptoms of depression and command auditory hallucinations to kill himself. Pt with limited insight in to his illness, stays withdrawn , has been compliant on his medications. Pt was willing to be discharged to shelter today, however no shelter beds available .    Treatment Plan Summary: Daily contact with patient to assess and evaluate symptoms and progress in treatment and Medication management   Will increase Seroquel to 50 mg po qam and 75 mg po qhs  Increased Gabapentin to 200 mg po tid for mood sx,tremors, pain. Will continue trazodone 100 mg po qhs for sleep. Will make available Vistaril 25 mg po tid prn for anxiety sx. Added  Lidocaine patch for pain. Falls precautions.  Will continue to monitor vitals ,medication compliance and treatment side effects while patient is here.  Will monitor for medical issues as well as call consult as needed.  Reviewed labs ,cmp reviewed- AST/ALT elevated - Hep C ab positive - as per Hospitalist consult - HCV RNA quant test-  reviewed.  CSW will start working on disposition.. Pt was willing to leave today - however no bed confirmation received from shelters for placement . Patient to participate in therapeutic milieu .      Medical Decision Making:  Review of Psycho-Social Stressors (1), Review or order clinical lab tests (1), Decision to obtain old records (1), Review of Last Therapy Session (1), Review of Medication Regimen & Side Effects (2) and Review of New Medication or Change in Dosage (2)     Winferd Wease MD 12/10/2014, 2:58 PM

## 2014-12-10 NOTE — BHH Group Notes (Signed)
[facility] LCSW Group Therapy  12/10/2014  1:05 PM  Type of Therapy:  Group therapy  Participation Level:  Active  Participation Quality:  Attentive  Affect:  Flat  Cognitive:  Oriented  Insight:  Limited  Engagement in Therapy:  Limited  Modes of Intervention:  Discussion, Socialization  Summary of Progress/Problems:  Chaplain was here to lead a group on themes of hope and courage. "My hope came when the paramedics came to pick me up at 1:30 in the AM when I arrived here at the bus station.  I freaked out because I knew no one here, and did not know where I was going.  Sometimes other people are our source of hope when we have none ourselves.  I was taking a risk to make a change, and I hope it still works."  [place], Barbaraann Rondo B 12/10/2014 1:41 PM

## 2014-12-10 NOTE — BHH Suicide Risk Assessment (Deleted)
[facility] Discharge Suicide Risk Assessment   Demographic Factors:  Male  Total Time spent with patient: 30 minutes  Musculoskeletal: Strength & Muscle Tone: within normal limits Gait & Station: normal Patient leans: N/A  Psychiatric Specialty Exam: Physical Exam  Review of Systems  Psychiatric/Behavioral: Positive for hallucinations (chronic improved , on and off) and substance abuse. Negative for depression and suicidal ideas.  All other systems reviewed and are negative.   Blood pressure 118/66, pulse 70, temperature 98.5 F (36.9 C), temperature source Oral, resp. rate 16, height 6\' 5"  (1.956 m), weight 81.647 kg (180 lb), SpO2 100 %.Body mass index is 21.34 kg/(m^2).  General Appearance: Fairly Groomed  Engineer, water::  Fair  Speech:  Normal [id number]  Volume:  Normal  Mood:  Euthymic  Affect:  Appropriate  Thought Process:  Coherent  Orientation:  Full (Time, Place, and Person)  Thought Content:  has chronic AH - vague that comes at times   Suicidal Thoughts:  No  Homicidal Thoughts:  No  Memory:  Immediate;   Fair Recent;   Fair Remote;   Fair  Judgement:  Impaired  Insight:  Shallow  Psychomotor Activity:  Normal  Concentration:  Fair  Recall:  [employer] of Knowledge:Fair  Language: Fair  Akathisia:  No  Handed:  Right  AIMS (if indicated):     Assets:  Communication Skills Desire for Improvement  Sleep:  Number of Hours: 6.5  Cognition: WNL  ADL's:  Intact   Have you used any form of tobacco in the last 30 days? (Cigarettes, Smokeless Tobacco, Cigars, and/or Pipes): Yes  Has this patient used any form of tobacco in the last 30 days? (Cigarettes, Smokeless Tobacco, Cigars, and/or Pipes) Yes, Prescription provided with nicotine patch  Mental Status Per Nursing Assessment::   On Admission:  Suicidal ideation indicated by patient  Current Mental Status by Physician: pt denies SI/HI/VH - does report chronic AH that comes and goes  Loss Factors: Financial  problems/change in socioeconomic status  Historical Factors: Impulsivity  Risk Reduction Factors:   Positive therapeutic relationship  Continued Clinical Symptoms:  Alcohol/Substance Abuse/Dependencies Previous Psychiatric Diagnoses and Treatments  Cognitive Features That Contribute To Risk:  None    Suicide Risk:  Minimal: No identifiable suicidal ideation.  Patients presenting with no risk factors but with morbid ruminations; may be classified as minimal risk based on the severity of the depressive symptoms  Principal Problem: Schizoaffective disorder, bipolar type Discharge Diagnoses:  Patient Active Problem List   Diagnosis Date Noted  . Opioid use disorder, severe, dependence [F11.20] 12/07/2014  . Hepatitis C [B19.20] 12/07/2014  . Schizoaffective disorder, bipolar type [F25.0] 12/07/2014    Follow-up Information    Schedule an appointment as soon as possible for a visit with Need to follow up with GI for positve Hep C results.      Schedule an appointment as soon as possible for a visit with Please use the resources provided to you in [facility] ED by Case manager to assist with re applying for medicaid and getting new pcp in [place] .      Follow up with [facility].   Why:  Go to the walk-in clinic between 8 and 3 M-F for your hospital follow up appointment. This is mental health where you will see a psychitrist.   Contact information:   [place] recommendations:  Activity:  no restrictions Diet:  regular  Tests:  as per out pt recommendations Other:  follow up with Hep C clinic - will have information on your DC instructions  Is patient on multiple antipsychotic therapies at discharge:  No   Has Patient had three or more failed trials of antipsychotic monotherapy by history:  No  Recommended Plan for Multiple Antipsychotic Therapies: NA    Desma Wilkowski MD 12/10/2014, 9:19 AM

## 2014-12-10 NOTE — Tx Team (Signed)
Interdisciplinary Treatment Plan Update (Adult)  Date:  12/10/2014   Time Reviewed:  1:11 PM   Progress in Treatment: Attending groups: Yes. Participating in groups:  Yes. Taking medication as prescribed:  Yes. Tolerating medication:  Yes. Family/Significant othe contact made:  No Patient understands diagnosis:  Yes  As evidenced by seeking help with "voices and depression" Discussing patient identified problems/goals with staff:  Yes, see initial care plan. Medical problems stabilized or resolved:  Yes. Denies suicidal/homicidal ideation: Yes. Issues/concerns per patient self-inventory:  No. Other:  New problem(s) identified:  Discharge Plan or Barriers: see below  Reason for Continuation of Hospitalization: Depression Hallucinations Medication stabilization  Comments:Pt is a 54 y.o. AA male, divorced, unemployed , who has a hx of schizophrenia vs Bipolar do as well as severe opioid use disorder, presented to to [facility] ED reporting symptoms of depression and command auditory hallucinations to kill himself. Pt per initial notes in EHR " reported he was discharged from [facility] in [place] two days ago after being admitted for cutting his wrist in a suicide attempt. Pt says he was discharged on 12/03/14 and given a bus pass to [place] with discharged instructions to go to [employer] and a list of behavior health resources [place].  Neurontin, Seroquel trial Estimated length of stay: D/C Monday  New goal(s):  Review of initial/current patient goals per problem list:   Review of initial/current patient goals per problem list:  1. Goal(s): Patient will participate in aftercare plan   Met:    Target date: 3-5 days post admission date   As evidenced by: Patient will participate within aftercare plan AEB aftercare provider and housing plan at discharge being identified.  12/10/2014:Will go to [facility], follow up outpt   2.  Goal (s): Patient will exhibit decreased depressive symptoms and suicidal ideations.   Met: No   Target date: 3-5 days post admission date   As evidenced by: Patient will utilize self rating of depression at 3 or below and demonstrate decreased signs of depression or be deemed stable for discharge by MD. 12/10/2014 Rates depression at a 5 today  5. Goal(s): Patient will demonstrate decreased signs of psychosis  * Met: Yes  * Target date: 3-5 days post admission date  * As evidenced by: Patient will demonstrate decreased frequency of AVH or return to baseline function  12/10/2014 No signs nor symptoms of psychosis today         Attendees: Patient:  12/10/2014 1:11 PM   Family:   12/10/2014 1:11 PM   Physician:  Ursula Alert, MD 12/10/2014 1:11 PM   Nursing:   Marcella Dubs, RN 12/10/2014 1:11 PM   CSW:    Roque Lias, [place]   12/10/2014 1:11 PM   Other:  12/10/2014 1:11 PM   Other:   12/10/2014 1:11 PM   Other:  Lars Pinks, Nurse CM 12/10/2014 1:11 PM   Other:  Lucinda Dell, [facility] 12/10/2014 1:11 PM   Other:  Norberto Sorenson, [place]  12/10/2014 1:11 PM   Other:  12/10/2014 1:11 PM   Other:  12/10/2014 1:11 PM   Other:  12/10/2014 1:11 PM   Other:  12/10/2014 1:11 PM   Other:  12/10/2014 1:11 PM   Other:   12/10/2014 1:11 PM    Scribe for Treatment Team:   Trish Mage, 12/10/2014 1:11 PM

## 2014-12-10 NOTE — Progress Notes (Signed)
D:  Per pt self inventory pt reports sleeping poor, appetite fair, energy level low, ability to pay attention good, rates depression at a 5 out of 10, hopelessness at a 5 out of 10, anxiety at a 7 out of 10, bright affect during interaction, endorses SI, on and off, contracts for safety, denies HI/AVH, pt did not set goal for himself today.     A:  Emotional support provided, Encouraged pt to continue with treatment plan and attend all group activities, q15 min checks maintained for safety.  R:  Pt has been staying in bed for most of the day, unwilling to get out of bed for medications and groups, interacts appropriately, pt did get out of bed for afternoon group, plan was for pt to d/c today however per Rod the SW he is trying to confirm a bed at the homeless shelter.

## 2014-12-10 NOTE — Progress Notes (Signed)
D   Pt is calm and cooperative   He presently denies suicidal ideation but is very vague about hallucinations and HI   He informed staff that [facility] but said the social worker would have to call them to confirm it    He seems to have brightened up with that news  A   Verbal support given   Medications administered and effectiveness monitored   Q 15 min checks R   Pt is safe

## 2014-12-10 NOTE — Progress Notes (Signed)
[place] Group Notes:  (Nursing/MHT/Case Management/Adjunct)  Date:  12/10/2014  Time:  9:32 PM  Type of Therapy:  Psychoeducational Skills  Participation Level:  Active  Participation Quality:  Appropriate  Affect:  Appropriate  Cognitive:  Appropriate  Insight:  Improving  Engagement in Group:  Developing/Improving  Modes of Intervention:  Education  Summary of Progress/Problems: The patient expressed in group that initially he was unhappy about not being discharged, but understands at that the same time that it will take time to find him placement. In addition, he expressed that his male friend followed him to [facility]. And that he was displeased with this. As a theme for the day, his relapse prevention will involve remaining on his medication and playing basketball.   Archie Balboa S 12/10/2014, 9:32 PM

## 2014-12-11 MED ORDER — QUETIAPINE FUMARATE 100 MG PO TABS: 100.0000 mg | ORAL_TABLET | Freq: Every day | ORAL | Status: DC

## 2014-12-11 MED ORDER — QUETIAPINE FUMARATE 50 MG PO TABS
150.0000 mg | ORAL_TABLET | Freq: Every day | ORAL | Status: DC
  Administered 2014-12-12: 150 mg via ORAL
  Filled 2014-12-11 (×3): qty 1

## 2014-12-11 MED ORDER — QUETIAPINE FUMARATE 100 MG PO TABS
100.0000 mg | ORAL_TABLET | Freq: Every day | ORAL | Status: AC
  Administered 2014-12-11: 100 mg via ORAL
  Filled 2014-12-11: qty 1

## 2014-12-11 MED ORDER — TRAZODONE HCL 100 MG PO TABS
100.0000 mg | ORAL_TABLET | Freq: Every evening | ORAL | Status: DC | PRN
  Administered 2014-12-11 – 2014-12-12 (×2): 100 mg via ORAL
  Filled 2014-12-11 (×2): qty 1

## 2014-12-11 NOTE — Progress Notes (Signed)
[place] [facility] [place] Group Notes:  (Nursing/MHT/Case Management/Adjunct)  Date:  12/11/2014  Time:  8:58 PM  Type of Therapy:  Psychoeducational Skills  Participation Level:  Minimal  Participation Quality:  Attentive  Affect:  Flat  Cognitive:  Appropriate  Insight:  Appropriate  Engagement in Group:  Engaged  Modes of Intervention:  Education  Summary of Progress/Problems: The patient described his day as having been "shifty" overall. The patient explained that his knee cap popped out of his socket. He also states that one of his peers stepped on his foot earlier in the day. Finally, the patient shared that he had a number of bad telephone calls during the daytime. As for the theme of the day, his support system will be comprised of his sister and mother.   Archie Balboa S 12/11/2014, 8:58 PM

## 2014-12-11 NOTE — Progress Notes (Signed)
D: Patient denies SI/HI and auditory and visual hallucinations. Patient has a depressed mood and a blunted affect. The patient is unwilling to participate within the milieu and isolates to his room throughout the shift. The patient refused his morning medications and states that he will "take them later if he feels like it."   A: Patient given emotional support from RN. Patient encouraged to come to staff with concerns and/or questions. Patient's medication routine continued. Patient's orders and plan of care reviewed. RN discussed patient's hesitance to take medications. RN attempted to educate patient regarding medication compliance but patient stated he "did not want to talk."  R: Patient remains safe. Will continue to monitor patient q15 minutes for safety.

## 2014-12-11 NOTE — Plan of Care (Signed)
Problem: Alteration in mood & ability to function due to Goal: LTG-Pt reports reduction in suicidal thoughts (Patient reports reduction in suicidal thoughts and is able to verbalize a safety plan for whenever patient is feeling suicidal)  Outcome: Progressing Pt denies SI at this time     

## 2014-12-11 NOTE — Progress Notes (Signed)
D: Pt denies SI/HI/AVH. Pt is pleasant and cooperative, but pt has sad depressed affect. Pt minimal interaction but will talk with staff and peers.  A: Pt was offered support and encouragement. Pt was given scheduled medications. Pt was encourage to attend groups. Q 15 minute checks were done for safety.   R:Pt attends groups and interacts well with peers and staff. Pt is taking medication. Pt has no complaints.Pt receptive to treatment and safety maintained on unit.

## 2014-12-11 NOTE — BHH Group Notes (Signed)
[place] LCSW Group Therapy  12/11/2014   11:15 AM  Type of Therapy:  Group Therapy  Participation Level:  Did Not Attend despite encouragement of RN and CSW   Lyla Glassing 12/11/2014

## 2014-12-11 NOTE — Progress Notes (Signed)
Morning Wellness Group 0900  Patient did not attend  The focus of this group is to educate the patient on the purpose and policies of crisis stabilization and provide a format to answer questions about their admission.  The group details unit policies and expectations of patients while admitted.

## 2014-12-11 NOTE — Progress Notes (Addendum)
[facility] MD Progress Note  12/11/2014 4:00 PM Ronnie Burton  MRN:  [id number] Subjective: Patient states " I am sore ."   Objective: Pt is a 54 y.o. AA male, divorced, unemployed , who has a hx of schizophrenia vs Bipolar do as well as severe opioid use disorder, presented to to [facility] ED reporting symptoms of depression and command auditory hallucinations to kill himself. Pt per initial notes in EHR " reported he was discharged from [facility] in [place] two days ago after being admitted for cutting his wrist in a suicide attempt. Pt says he was discharged on 12/03/14 and given a bus pass to [place] with discharged instructions to go to [employer] and a list of behavior health resources [place].  Patient seen and chart reviewed.Discussed patient with treatment team. Pt today seen in bed , appears to be still depressed, avoids eye contact as always .States he is sore all over . Pt reports sleep as improved last night. Pt reports AH as responding to medications. Per staff - pt continues be symptomatic - is in pain ,no disruptive issues noted on the unit.However he did voice SI to staff- denied it to Probation officer.      Principal Problem: Schizoaffective disorder, bipolar type Diagnosis:   Patient Active Problem List   Diagnosis Date Noted  . Opioid use disorder, severe, dependence [F11.20] 12/07/2014  . Hepatitis C [B19.20] 12/07/2014  . Schizoaffective disorder, bipolar type [F25.0] 12/07/2014   Total Time spent with patient: 20 minutes   Past Medical History:  Past Medical History  Diagnosis Date  . Hypertension   . Schizophrenia    History reviewed. No pertinent past surgical history. Family History:  Family History  Problem Relation Age of Onset  . Alcoholism Father    Social History:  History  Alcohol Use No     History  Drug Use No    Social History   Social History  . Marital Status: Single    Spouse Name: N/A  . Number of  Children: N/A  . Years of Education: N/A   Social History Main Topics  . Smoking status: Current Every Day Smoker -- 0.50 packs/day    Types: Cigarettes  . Smokeless tobacco: Never Used  . Alcohol Use: No  . Drug Use: No  . Sexual Activity: Yes   Other Topics Concern  . None   Social History Narrative   Additional History:    Sleep: Fair   Appetite:  Fair      Musculoskeletal: Strength & Muscle Tone: within normal limits Gait & Station: normal Patient leans: N/A   Psychiatric Specialty Exam: Physical Exam  Musculoskeletal:       Right shoulder: Normal. He exhibits normal range of motion and no tenderness.       Left shoulder: Normal.       Right elbow: Normal.      Left elbow: Normal.       Right wrist: Normal.       Left wrist: Normal.       Right hip: Normal. He exhibits normal range of motion and normal strength.       Left hip: Normal.       Right knee: Normal. He exhibits normal range of motion and no swelling. No tenderness found.       Left knee: Normal.       Right ankle: Normal. No tenderness.       Left ankle: Normal.  Review of Systems  Musculoskeletal: Positive for myalgias and back pain. Negative for joint pain and falls.  Psychiatric/Behavioral: Positive for depression and substance abuse. The patient is nervous/anxious.   All other systems reviewed and are negative.   Blood pressure 92/58, pulse 88, temperature 97.7 F (36.5 C), temperature source Oral, resp. rate 18, height 6\' 5"  (1.956 m), weight 81.647 kg (180 lb), SpO2 100 %.Body mass index is 21.34 kg/(m^2).  General Appearance: Disheveled  Eye Contact::  Minimal  Speech:  Normal Rate  Volume:  Decreased  Mood:  Anxious and Depressed  Affect:  Depressed  Thought Process:  Coherent  Orientation:  Full (Time, Place, and Person)  Thought Content:  Hallucinations: Auditory and Rumination improving  Suicidal Thoughts:  No reported SI to staff   Homicidal Thoughts:  No  Memory:   Immediate;   Fair Recent;   Fair Remote;   Fair  Judgement:  Impaired  Insight:  Lacking  Psychomotor Activity:  Decreased  Concentration:  Poor  Recall:  [employer] of Knowledge:Fair  Language: Fair  Akathisia:  No  Handed:  Right  AIMS (if indicated):     Assets:  Others:  access to health care  ADL's:  Intact  Cognition: WNL  Sleep:  Number of Hours: 6.25     Current Medications: Current Facility-Administered Medications  Medication Dose Route Frequency Provider Last Rate Last Dose  . acetaminophen (TYLENOL) tablet 650 mg  650 mg Oral Q6H PRN Patrecia Pour, NP   650 mg at 12/09/14 2123  . alum & mag hydroxide-simeth (MAALOX/MYLANTA) 200-200-20 MG/5ML suspension 30 mL  30 mL Oral Q4H PRN Patrecia Pour, NP      . feeding supplement (ENSURE ENLIVE) (ENSURE ENLIVE) liquid 237 mL  237 mL Oral BID BM Tasnia Spegal, MD   237 mL at 12/11/14 1500  . gabapentin (NEURONTIN) capsule 200 mg  200 mg Oral TID Ursula Alert, MD   200 mg at 12/11/14 1126  . hydrOXYzine (ATARAX/VISTARIL) tablet 50 mg  50 mg Oral TID PRN Patrecia Pour, NP   50 mg at 12/10/14 2109  . ibuprofen (ADVIL,MOTRIN) tablet 600 mg  600 mg Oral Q8H PRN Patrecia Pour, NP   600 mg at 12/11/14 1127  . lidocaine (LIDODERM) 5 % 1 patch  1 patch Transdermal Q24H Ursula Alert, MD   1 patch at 12/10/14 1710  . magnesium hydroxide (MILK OF MAGNESIA) suspension 30 mL  30 mL Oral Daily PRN Patrecia Pour, NP      . nicotine (NICODERM CQ - dosed in mg/24 hours) patch 21 mg  21 mg Transdermal Daily Patrecia Pour, NP   21 mg at 12/11/14 1013  . ondansetron (ZOFRAN) tablet 4 mg  4 mg Oral Q8H PRN Patrecia Pour, NP      . QUEtiapine (SEROQUEL) tablet 100 mg  100 mg Oral QHS Ursula Alert, MD      . Derrill Memo ON 12/12/2014] QUEtiapine (SEROQUEL) tablet 150 mg  150 mg Oral QHS Laurenashley Viar, MD      . traZODone (DESYREL) tablet 100 mg  100 mg Oral QHS PRN Ursula Alert, MD        Lab Results:  No results found for this or any  previous visit (from the past 48 hour(s)).  Physical Findings: AIMS: Facial and Oral Movements Muscles of Facial Expression: None, normal Lips and Perioral Area: None, normal Jaw: None, normal Tongue: None, normal,Extremity Movements Upper (arms, wrists, hands, fingers): None, normal  Lower (legs, knees, ankles, toes): None, normal, Trunk Movements Neck, shoulders, hips: None, normal, Overall Severity Severity of abnormal movements (highest score from questions above): None, normal Incapacitation due to abnormal movements: None, normal Patient's awareness of abnormal movements (rate only patient's report): No Awareness, Dental Status Current problems with teeth and/or dentures?: Yes (missing teeth)  CIWA:    COWS:     Assessment: Pt is a 54 y.o. AA male, divorced, unemployed , who has a hx of schizophrenia vs Bipolar do as well as severe opioid use disorder, presented to to [place] ED reporting symptoms of depression and command auditory hallucinations to kill himself. Pt with limited insight in to his illness, stays withdrawn , has been compliant on his medications.    Treatment Plan Summary: Daily contact with patient to assess and evaluate symptoms and progress in treatment and Medication management   Will change Seroquel to 175 mg po qhs at bedtime .  Increased Gabapentin to 200 mg po tid for mood sx,tremors, pain. Will continue trazodone 100 mg po qhs for sleep. Will make available Vistaril 25 mg po tid prn for anxiety sx. Added  Lidocaine patch for pain. Falls precautions.  Will continue to monitor vitals ,medication compliance and treatment side effects while patient is here.  Will monitor for medical issues as well as call consult as needed.  Reviewed labs ,cmp reviewed- AST/ALT elevated - Hep C ab positive - as per Hospitalist consult - HCV RNA quant test- reviewed.  CSW will start working on disposition.. Pt was willing to leave today - however no bed confirmation  received from shelters for placement . Patient to participate in therapeutic milieu .      Medical Decision Making:  Review of Psycho-Social Stressors (1), Review or order clinical lab tests (1), Decision to obtain old records (1), Review of Last Therapy Session (1), Review of Medication Regimen & Side Effects (2) and Review of New Medication or Change in Dosage (2)     Braylynn Lewing MD 12/11/2014, 4:00 PM

## 2014-12-11 NOTE — Progress Notes (Deleted)
Patient ID: Ronnie Burton, male   DOB: Apr 24, 1960, 54 y.o.   MRN: [id number] Adult Psychoeducational Group Note  Date:  12/11/2014 Time: 09:15 am  Group Topic/Focus:  Healthy Communication:   The focus of this group is to discuss communication, barriers to communication, as well as healthy ways to communicate with others.  Participation Level:  Did Not Attend  Participation Quality: n/a  Affect: n/a  Cognitive: n/a  Insight: n/a  Engagement in Group: n/a  Modes of Intervention:  Activity, Discussion, Education, Role-play and Support  Additional Comments:  Pt did not attend group. Pt in bed asleep.   Elenore Rota 12/11/2014, 11:05 AM

## 2014-12-12 NOTE — Progress Notes (Signed)
Patient ID: Ronnie Burton, male   DOB: 10-16-1960, 54 y.o.   MRN: [id number] D: Patient reports sleep improving. Denies HI, SI reports AH that have told him to harm self have lessened. Patient has slept in room much of the day but interacts appropriately with peers when up on unit.  Patient has a depressed mood and affect.   A: Patient given emotional support from RN. Refused to get up for AM meds. Patient encouraged to attend groups and unit activities. Patient encouraged to come to staff with any questions or concerns.  R: Patient remains somewhat isolative but cooperative. Will continue to monitor patient for safety.

## 2014-12-12 NOTE — BHH Group Notes (Signed)
[place] Group Notes: (Clinical Social Work)   12/12/2014      Type of Therapy:  Group Therapy   Participation Level:  Did Not Attend despite MHT prompting   Selmer Dominion, LCSW 12/12/2014, 5:49 PM

## 2014-12-12 NOTE — Progress Notes (Signed)
Adult Psychoeducational Group Note  Date:  12/12/2014 Time:  9:53 PM  Group Topic/Focus:  Wrap-Up Group:   The focus of this group is to help patients review their daily goal of treatment and discuss progress on daily workbooks.  Participation Level:  Active  Participation Quality:  Appropriate  Affect:  Appropriate  Cognitive:  Appropriate  Insight: Appropriate  Engagement in Group:  Engaged  Modes of Intervention:  Discussion  Additional Comments:The patient expressed that he attended group.The patient also said that music therapy makes him depressed.  Nash Shearer 12/12/2014, 9:53 PM

## 2014-12-12 NOTE — Plan of Care (Signed)
Problem: Ineffective individual coping Goal: LTG: Patient will report a decrease in negative feelings Outcome: Progressing Pt stated he was felling a lot better  Problem: Diagnosis: Increased Risk For Suicide Attempt Goal: LTG-Patient Will Report Improved Mood and Deny Suicidal LTG (by discharge) Patient will report improved mood and deny suicidal ideation.  Outcome: Progressing Pt denies SI at this time  Goal: STG-Patient Will Attend All Groups On The Unit Outcome: Progressing Pt stated he went to the groups on the unit  Problem: Alteration in mood & ability to function due to Goal: LTG-Pt reports reduction in suicidal thoughts (Patient reports reduction in suicidal thoughts and is able to verbalize a safety plan for whenever patient is feeling suicidal)  Outcome: Progressing Pt denies SI at this time

## 2014-12-12 NOTE — Progress Notes (Signed)
D: Pt denies SI/HI/AVH. Pt is pleasant and cooperative. Pt in bed on approach. Pt happy he's leaving tomorrow. Pt stated he was ready to go. Pt stated he felt he was getting better and hopeful about his future .   A: Pt was offered support and encouragement. Pt was given scheduled medications. Pt was encourage to attend groups. Q 15 minute checks were done for safety.   R:Pt attends groups and interacts well with peers and staff. Pt is taking medication. Pt has no complaints at this time .Pt receptive to treatment and safety maintained on unit.

## 2014-12-12 NOTE — BHH Group Notes (Signed)
[place] Group Notes:  (Nursing/MHT/Case Management/Adjunct)  Date:  12/12/2014  Time:  11:57 AM  Type of Therapy:  Psychoeducational Skills  Participation Level:  Did Not Attend  Participation Quality:  Did Not Attend  Affect:  Did Not Attend  Cognitive:  Did Not Attend  Insight:  None  Engagement in Group:  Did Not Attend  Modes of Intervention:  Did Not Attend  Summary of Progress/Problems: Pt did not attend patient self inventory group.   Benancio Deeds Shanta 12/12/2014, 11:57 AM

## 2014-12-12 NOTE — Progress Notes (Addendum)
[facility] MD Progress Note  12/12/2014 3:08 PM Ronnie Burton  MRN:  [id number] Subjective: Patient states " I am OK.'   Objective: Pt is a 54 y.o. AA male, divorced, unemployed , who has a hx of schizophrenia vs Bipolar do as well as severe opioid use disorder, presented to to [facility] ED reporting symptoms of depression and command auditory hallucinations to kill himself. Pt per initial notes in EHR " reported he was discharged from [facility] in [place] two days ago after being admitted for cutting his wrist in a suicide attempt. Pt says he was discharged on 12/03/14 and given a bus pass to [place] with discharged instructions to go to [employer] and a list of behavior health resources [place].  Patient seen and chart reviewed.Discussed patient with treatment team. Pt today seen in bed , reports mood as improving. Pt reports sleep as improved last night. Pt reports AH as responding to medications. Per staff - pt is improving - no disruptive issues noted on the unit.      Principal Problem: Schizoaffective disorder, bipolar type Diagnosis:   Patient Active Problem List   Diagnosis Date Noted  . Opioid use disorder, severe, dependence [F11.20] 12/07/2014  . Hepatitis C [B19.20] 12/07/2014  . Schizoaffective disorder, bipolar type [F25.0] 12/07/2014   Total Time spent with patient: 20 minutes   Past Medical History:  Past Medical History  Diagnosis Date  . Hypertension   . Schizophrenia    History reviewed. No pertinent past surgical history. Family History:  Family History  Problem Relation Age of Onset  . Alcoholism Father    Social History:  History  Alcohol Use No     History  Drug Use No    Social History   Social History  . Marital Status: Single    Spouse Name: N/A  . Number of Children: N/A  . Years of Education: N/A   Social History Main Topics  . Smoking status: Current Every Day Smoker -- 0.50 packs/day   Types: Cigarettes  . Smokeless tobacco: Never Used  . Alcohol Use: No  . Drug Use: No  . Sexual Activity: Yes   Other Topics Concern  . None   Social History Narrative   Additional History:    Sleep: Fair   Appetite:  Fair      Musculoskeletal: Strength & Muscle Tone: within normal limits Gait & Station: normal Patient leans: N/A   Psychiatric Specialty Exam: Physical Exam  Musculoskeletal:       Right shoulder: Normal. He exhibits normal range of motion and no tenderness.       Left shoulder: Normal.       Right elbow: Normal.      Left elbow: Normal.       Right wrist: Normal.       Left wrist: Normal.       Right hip: Normal. He exhibits normal range of motion and normal strength.       Left hip: Normal.       Right knee: Normal. He exhibits normal range of motion and no swelling. No tenderness found.       Left knee: Normal.       Right ankle: Normal. No tenderness.       Left ankle: Normal.    Review of Systems  Musculoskeletal: Positive for myalgias and back pain. Negative for joint pain and falls.  Psychiatric/Behavioral: Positive for depression and substance abuse. The patient is nervous/anxious.  All other systems reviewed and are negative.   Blood pressure 104/59, pulse 80, temperature 98.1 F (36.7 C), temperature source Oral, resp. rate 16, height 6\' 5"  (1.956 m), weight 81.647 kg (180 lb), SpO2 100 %.Body mass index is 21.34 kg/(m^2).  General Appearance: Disheveled  Eye Contact::  Minimal  Speech:  Normal Rate  Volume:  Decreased  Mood:  Depressed  Affect:  Congruent  Thought Process:  Coherent  Orientation:  Full (Time, Place, and Person)  Thought Content:  Hallucinations: Auditory and Rumination improving  Suicidal Thoughts:  No   Homicidal Thoughts:  No  Memory:  Immediate;   Fair Recent;   Fair Remote;   Fair  Judgement:  Impaired  Insight:  Lacking  Psychomotor Activity:  Decreased  Concentration:  Fair  Recall:  [employer] of  Knowledge:Fair  Language: Fair  Akathisia:  No  Handed:  Right  AIMS (if indicated):     Assets:  Others:  access to health care  ADL's:  Intact  Cognition: WNL  Sleep:  Number of Hours: 6.25     Current Medications: Current Facility-Administered Medications  Medication Dose Route Frequency Provider Last Rate Last Dose  . acetaminophen (TYLENOL) tablet 650 mg  650 mg Oral Q6H PRN Patrecia Pour, NP   650 mg at 12/09/14 2123  . alum & mag hydroxide-simeth (MAALOX/MYLANTA) 200-200-20 MG/5ML suspension 30 mL  30 mL Oral Q4H PRN Patrecia Pour, NP      . feeding supplement (ENSURE ENLIVE) (ENSURE ENLIVE) liquid 237 mL  237 mL Oral BID BM Chakara Bognar, MD   237 mL at 12/11/14 1500  . gabapentin (NEURONTIN) capsule 200 mg  200 mg Oral TID Ursula Alert, MD   200 mg at 12/12/14 1301  . hydrOXYzine (ATARAX/VISTARIL) tablet 50 mg  50 mg Oral TID PRN Patrecia Pour, NP   50 mg at 12/11/14 2111  . ibuprofen (ADVIL,MOTRIN) tablet 600 mg  600 mg Oral Q8H PRN Patrecia Pour, NP   600 mg at 12/11/14 2111  . lidocaine (LIDODERM) 5 % 1 patch  1 patch Transdermal Q24H Ursula Alert, MD   1 patch at 12/11/14 1721  . magnesium hydroxide (MILK OF MAGNESIA) suspension 30 mL  30 mL Oral Daily PRN Patrecia Pour, NP      . nicotine (NICODERM CQ - dosed in mg/24 hours) patch 21 mg  21 mg Transdermal Daily Patrecia Pour, NP   21 mg at 12/11/14 1013  . ondansetron (ZOFRAN) tablet 4 mg  4 mg Oral Q8H PRN Patrecia Pour, NP      . QUEtiapine (SEROQUEL) tablet 150 mg  150 mg Oral QHS Ursula Alert, MD      . traZODone (DESYREL) tablet 100 mg  100 mg Oral QHS PRN Ursula Alert, MD   100 mg at 12/11/14 2111    Lab Results:  No results found for this or any previous visit (from the past 89 hour(s)).  Physical Findings: AIMS: Facial and Oral Movements Muscles of Facial Expression: None, normal Lips and Perioral Area: None, normal Jaw: None, normal Tongue: None, normal,Extremity Movements Upper (arms,  wrists, hands, fingers): None, normal Lower (legs, knees, ankles, toes): None, normal, Trunk Movements Neck, shoulders, hips: None, normal, Overall Severity Severity of abnormal movements (highest score from questions above): None, normal Incapacitation due to abnormal movements: None, normal Patient's awareness of abnormal movements (rate only patient's report): No Awareness, Dental Status Current problems with teeth and/or dentures?: Yes (missing teeth)  CIWA:    COWS:     Assessment: Pt is a 53 y.o. AA male, divorced, unemployed , who has a hx of schizophrenia vs Bipolar do as well as severe opioid use disorder, presented to to [place] ED reporting symptoms of depression and command auditory hallucinations to kill himself. Pt with limited insight in to his illness, stays withdrawn , has been compliant on his medications.    Treatment Plan Summary: Daily contact with patient to assess and evaluate symptoms and progress in treatment and Medication management   Will change Seroquel to 150 po qhs at bedtime .  Increased Gabapentin to 200 mg po tid for mood sx,tremors, pain. Will continue trazodone 100 mg po qhs prn for sleep. Will make available Vistaril 25 mg po tid prn for anxiety sx. Added  Lidocaine patch for pain. Falls precautions.  Will continue to monitor vitals ,medication compliance and treatment side effects while patient is here.  Will monitor for medical issues as well as call consult as needed.  Reviewed labs ,cmp reviewed- AST/ALT elevated - Hep C ab positive -pt to follow up with Hep C clinic on [place].  CSW will start working on disposition.. Pt was willing to leave today - however no bed confirmation received from shelters for placement . Patient to participate in therapeutic milieu .   Pt to be discharged tomorrow.   Medical Decision Making:  Review of Psycho-Social Stressors (1), Review or order clinical lab tests (1), Decision to obtain old records (1),  Review of Last Therapy Session (1), Review of Medication Regimen & Side Effects (2) and Review of New Medication or Change in Dosage (2)     Ruthell Feigenbaum MD 12/12/2014, 3:08 PM

## 2014-12-13 MED ORDER — QUETIAPINE FUMARATE 50 MG PO TABS: ORAL_TABLET | ORAL | Status: DC

## 2014-12-13 MED ORDER — HYDROXYZINE HCL 50 MG PO TABS: 50.0000 mg | ORAL_TABLET | Freq: Three times a day (TID) | ORAL | Status: DC | PRN

## 2014-12-13 MED ORDER — IBUPROFEN 800 MG PO TABS: 800.0000 mg | ORAL_TABLET | Freq: Three times a day (TID) | ORAL | Status: DC

## 2014-12-13 MED ORDER — GABAPENTIN 100 MG PO CAPS: 200.0000 mg | ORAL_CAPSULE | Freq: Three times a day (TID) | ORAL | Status: DC

## 2014-12-13 MED ORDER — NICOTINE 21 MG/24HR TD PT24: 21.0000 mg | MEDICATED_PATCH | Freq: Every day | TRANSDERMAL | Status: DC

## 2014-12-13 MED ORDER — QUETIAPINE FUMARATE 50 MG PO TABS: 150.0000 mg | ORAL_TABLET | Freq: Every day | ORAL | Status: DC

## 2014-12-13 MED ORDER — TRAZODONE HCL 150 MG PO TABS: 150.0000 mg | ORAL_TABLET | Freq: Every day | ORAL | Status: DC

## 2014-12-13 NOTE — Discharge Summary (Signed)
Physician Discharge Summary Note  Patient:  Ronnie Burton is an 54 y.o., male  MRN:  [id number]  DOB:  02/13/1961  Patient phone:  [phone] (home)   Patient address:   [place] [address],   Total Time spent with patient: Greater than 30 minutes  Date of Admission:  12/06/2014  Date of Discharge: 12-13-14   Reason for Admission: Drug detox/mood stabilization treatments  Principal Problem: Schizoaffective disorder, bipolar type Discharge Diagnoses: Patient Active Problem List   Diagnosis Date Noted  . Opioid use disorder, severe, dependence [F11.20] 12/07/2014  . Hepatitis C [B19.20] 12/07/2014  . Schizoaffective disorder, bipolar type [F25.0] 12/07/2014   Musculoskeletal: Strength & Muscle Tone: within normal limits Gait & Station: normal Patient leans: N/A  Psychiatric Specialty Exam: Physical Exam  Respiratory: No stridor.  Psychiatric: His speech is normal and behavior is normal. Judgment and thought content normal. His mood appears not anxious. His affect is not angry, not blunt, not labile and not inappropriate. Cognition and memory are normal. He does not exhibit a depressed mood.    Review of Systems  Constitutional: Negative.   HENT: Negative.   Eyes: Negative.   Respiratory: Negative.   Cardiovascular: Negative.   Gastrointestinal: Negative.   Genitourinary: Negative.   Musculoskeletal: Negative.   Skin: Negative.   Neurological: Negative.   Endo/Heme/Allergies: Negative.   Psychiatric/Behavioral: Positive for hallucinations (Hx of) and substance abuse (Opioid dependence). Negative for memory loss. Depression: Stable. The patient has insomnia (Stable). The patient is not nervous/anxious.     Blood pressure 107/64, pulse 84, temperature 97.8 F (36.6 C), temperature source Oral, resp. rate 18, height 6\' 5"  (1.956 m), weight 81.647 kg (180 lb), SpO2 100 %.Body mass index is 21.34 kg/(m^2).  See Md's SRA   Have you used any form of tobacco  in the last 30 days? (Cigarettes, Smokeless Tobacco, Cigars, and/or Pipes): Yes  Has this patient used any form of tobacco in the last 30 days? (Cigarettes, Smokeless Tobacco, Cigars, and/or Pipes): Yes, Prescription not provided because: nicotine patches  Past Medical History:  Past Medical History  Diagnosis Date  . Hypertension   . Schizophrenia    History reviewed. No pertinent past surgical history.  Family History:  Family History  Problem Relation Age of Onset  . Alcoholism Father    Social History:  History  Alcohol Use No     History  Drug Use No    Social History   Social History  . Marital Status: Single    Spouse Name: N/A  . Number of Children: N/A  . Years of Education: N/A   Social History Main Topics  . Smoking status: Current Every Day Smoker -- 0.50 packs/day    Types: Cigarettes  . Smokeless tobacco: Never Used  . Alcohol Use: No  . Drug Use: No  . Sexual Activity: Yes   Other Topics Concern  . None   Social History Narrative   Risk to Self: Is patient at risk for suicide?: No What has been your use of drugs/alcohol within the last 12 months?: Clean from heroin for several weeks  Risk to Others: No Prior Inpatient Therapy: Yes Prior Outpatient Therapy: Yes  Level of Care:  OP  Hospital Course:  Pt is a 54 y.o. AA male, divorced, unemployed, who has a hx of schizophrenia vs Bipolar do as well as severe opioid use disorde, presented to to [facility] ED reporting symptoms of depression and command auditory hallucinations to kill himself. Pt per initial  notes in EHR " reported he was discharged from [facility] in [place] two days ago after being admitted for cutting his wrist in a suicide attempt. Pt says he was discharged on 12/03/14 and given a bus pass to [place] with discharged instructions to go to [employer] and a list of behavior health resources [place]. Pt states he did not have money to purchase his  psychiatric medications. Pt states he arrived in [place] and came to [facility] because he was suicidal with thoughts of cutting his wrist. P reported hearing command auditory hallucinations to "do it" and kill yourself." Pt seen this AM. Patient today appears irritable - states he needs some sleep after the 18 hr long bus trip. Pt reports periodic mood lability when he is either irritable , angry or happy and does reckless things like abuse drugs or spend money inappropriately.   Ronnie Burton was admitted to the adult unit for worsening symptoms of bipolar/Schizophrenia disorder related to being off of his medicines due to lack of financial resources & being new in this area. During his admission assessment, he was evaluated and his symptoms were identified. Medication management was discussed & initiated targeting those presenting symptoms. He was oriented to the unit and encouraged to participate in the unit programming. He presented no other significant pre-existing medical problems that required treatment. However, he was monitored closely for any potential problems that may arise from his current treatment regimen. He tolerated his treatment regimen without any significant adverse effects or reactions.         During his hospital stay, Ronnie Burton was evaluated daily by a clinical provider to asure his response to his treatment regimen. Medication changes & adjustments were made according to need. As his hospital stay progressed, improvement was noted as evidence by his report of decreasing symptoms, improved sleep, appetite, affect, medication tolerance, behavior, and participation in the unit programming.  Ronnie Burton was required on daily basis to complete a self inventory asssessment noting mood, mental status, pain, any new symptoms, anxiety or concerns. His symptoms responded well to his treatment regimen, being in a therapeutic & supportive environment also assisted in his mood stability. Ronnie Burton did present  appropriate behavior & was motivated for recovery. He worked closely with the treatment team and case manager to develop a discharge plan with appropriate goals to maintain mood stability after discharge. Coping skills, problem solving as well as relaxation therapies were also part of his unit programming.  On this day of his discharge, Johntay is in much improved condition than upon admission. His symptoms were reported as significantly decreased or resolved completely. Upon discharge, he denies SIHI and voiced no AVH. He was motivated to continue taking medication with a goal of continued improvement in mental health. He was medicated & discharge on; Hydroxyzine 50 mg for anxiety, Seroquel 50 mg for mood control, Trazodone 150 mg for insomnia & Nicotine patch 21 mg for nicotine addiction. He is discharged to follow-up care at the [facility] both here in [place] for psychiatric, medication management and medical care. He is provided with all the necessary information needed to make these appointments without problems. Rubens was provided with a 14 days worth, supply samples of his [facility] discharge medications. He left [facility] in no apparent distress. Transportation per city bus/taxi. [facility] assisted with transportation fare.  Consults:  psychiatry  Significant Diagnostic Studies:  labs: CBC with diff, CMP, UDS, toxicology tests, U/A, results reviewed, stable  Discharge Vitals:   Blood pressure 107/64, pulse 84, temperature 97.8 F (36.6 C), temperature source Oral, resp. rate 18, height 6\' 5"  (1.956 m), weight 81.647 kg (180 lb), SpO2 100 %. Body mass index is 21.34 kg/(m^2). Lab Results:   No results found for this or any previous visit (from the past 72 hour(s)).  Physical Findings: AIMS: Facial and Oral Movements Muscles of Facial Expression: None, normal Lips and Perioral Area: None, normal Jaw: None, normal Tongue: None, normal,Extremity Movements Upper  (arms, wrists, hands, fingers): None, normal Lower (legs, knees, ankles, toes): None, normal, Trunk Movements Neck, shoulders, hips: None, normal, Overall Severity Severity of abnormal movements (highest score from questions above): None, normal Incapacitation due to abnormal movements: None, normal Patient's awareness of abnormal movements (rate only patient's report): No Awareness, Dental Status Current problems with teeth and/or dentures?: Yes (missing teeth)  CIWA:    COWS:     See Psychiatric Specialty Exam and Suicide Risk Assessment completed by Attending Physician prior to discharge.  Discharge destination:  Home  Is patient on multiple antipsychotic therapies at discharge:  No   Has Patient had three or more failed trials of antipsychotic monotherapy by history:  No  Recommended Plan for Multiple Antipsychotic Therapies: NA    Medication List    STOP taking these medications        haloperidol 5 MG tablet  Commonly known as:  HALDOL      TAKE these medications      Indication   hydrOXYzine 50 MG tablet  Commonly known as:  ATARAX/VISTARIL  Take 1 tablet (50 mg total) by mouth 3 (three) times daily as needed for anxiety.   Indication:  Anxiety     ibuprofen 800 MG tablet  Commonly known as:  ADVIL,MOTRIN  Take 1 tablet (800 mg total) by mouth 3 (three) times daily. For moderate pain   Indication:  Moderate pain     nicotine 21 mg/24hr patch  Commonly known as:  NICODERM CQ - dosed in mg/24 hours  Place 1 patch (21 mg total) onto the skin daily. For nicotine dependence   Indication:  Nicotine Addiction     QUEtiapine 50 MG tablet  Commonly known as:  SEROQUEL  Take 1 tablet (50 mg) twice daily & 3 tablets (150 mg) at bedtime: For mood control   Indication:  Mood control     traZODone 150 MG tablet  Commonly known as:  DESYREL  Take 1 tablet (150 mg total) by mouth at bedtime. For sleep   Indication:  Trouble Sleeping       Follow-up Information     Schedule an appointment as soon as possible for a visit with Need to follow up with GI for positve Hep C results.      Schedule an appointment as soon as possible for a visit with Please use the resources provided to you in [facility] ED by Case manager to assist with re applying for medicaid and getting new pcp in [place] .      Follow up with [facility].   Why:  Go to the walk-in clinic between 8 and 3 M-F for your hospital follow up appointment. This is mental health where you will see a psychitrist.   Contact information:   [place] [phone]      Schedule an appointment as soon as possible for a visit with [place]    .   Why:  need a follow up with hepatitis diagnosis   Contact information:   [address] [place] [address] [phone]     Follow-up recommendations:  Activity:  As tolerated Diet: As recommended by your primary care doctor. Keep all scheduled follow-up appointments as recommended.  Comments: Take all your medications as prescribed by your mental healthcare provider. Report any adverse effects and or reactions from your medicines to your outpatient provider promptly. Patient is instructed and cautioned to not engage in alcohol and or illegal drug use while on prescription medicines. In the event of worsening symptoms, patient is instructed to call the crisis hotline, 911 and or go to the nearest ED for appropriate evaluation and treatment of symptoms. Follow-up with your primary care provider for your other medical issues, concerns and or health care needs.   Total Discharge Time: Greater than 30 minutes  Signed: Encarnacion Slates, PMHNP, FNP-BC 12/13/2014, 11:51 AM  I personally assessed the patient and formulated the plan Geralyn Flash A. Sabra Heck, M.D.

## 2014-12-13 NOTE — [facility] Suicide Risk Assessment (Signed)
[facility] Discharge Suicide Risk Assessment   Demographic Factors:  Male  Total Time spent with patient: 30 minutes  Musculoskeletal: Strength & Muscle Tone: within normal limits Gait & Station: normal Patient leans: normal  Psychiatric Specialty Exam: Physical Exam  Review of Systems  Constitutional: Negative.   HENT: Negative.   Eyes: Negative.   Respiratory: Negative.   Cardiovascular: Negative.   Gastrointestinal: Negative.   Genitourinary: Negative.   Musculoskeletal: Positive for joint pain.  Skin: Negative.   Neurological: Negative.   Endo/Heme/Allergies: Negative.   Psychiatric/Behavioral: The patient is nervous/anxious.     Blood pressure 107/64, pulse 84, temperature 97.8 F (36.6 C), temperature source Oral, resp. rate 18, height 6\' 5"  (1.956 m), weight 81.647 kg (180 lb), SpO2 100 %.Body mass index is 21.34 kg/(m^2).  General Appearance: Fairly Groomed  Engineer, water::  Fair  Speech:  Normal [id number]  Volume:  Normal  Mood:  Euthymic  Affect:  Appropriate  Thought Process:  Coherent  Orientation:  Full (Time, Place, and Person)  Thought Content:  plans as he moves on, relapse prevention plan  Suicidal Thoughts:  No  Homicidal Thoughts:  No  Memory:  Immediate;   Fair Recent;   Fair Remote;   Fair  Judgement:  Fair  Insight:  Fair  Psychomotor Activity:  Normal  Concentration:  Fair  Recall:  [employer] of Knowledge:Fair  Language: Fair  Akathisia:  No  Handed:  Right  AIMS (if indicated):     Assets:  Desire for Improvement  Sleep:  Number of Hours: 6.75  Cognition: WNL  ADL's:  Intact   Have you used any form of tobacco in the last 30 days? (Cigarettes, Smokeless Tobacco, Cigars, and/or Pipes): Yes  Has this patient used any form of tobacco in the last 30 days? (Cigarettes, Smokeless Tobacco, Cigars, and/or Pipes) Yes, Prescription not provided because: nicotine patches  Mental Status Per Nursing Assessment::   On Admission:  Suicidal ideation  indicated by patient  Current Mental Status by Physician: In full contact with reality. There are no active SI plans or intent. He states he is committed to do well. He came down from [place]. Will be staying at the shelter   Loss Factors: NA  Historical Factors: NA  Risk Reduction Factors:   wants to do better  Continued Clinical Symptoms:  Bipolar Disorder:   Depressive phase Alcohol/Substance Abuse/Dependencies  Cognitive Features That Contribute To Risk:  Closed-mindedness, Polarized thinking and Thought constriction (tunnel vision)    Suicide Risk:  Minimal: No identifiable suicidal ideation.  Patients presenting with no risk factors but with morbid ruminations; may be classified as minimal risk based on the severity of the depressive symptoms  Principal Problem: Schizoaffective disorder, bipolar type Discharge Diagnoses:  Patient Active Problem List   Diagnosis Date Noted  . Opioid use disorder, severe, dependence [F11.20] 12/07/2014  . Hepatitis C [B19.20] 12/07/2014  . Schizoaffective disorder, bipolar type [F25.0] 12/07/2014    Follow-up Information    Schedule an appointment as soon as possible for a visit with Need to follow up with GI for positve Hep C results.      Schedule an appointment as soon as possible for a visit with Please use the resources provided to you in [facility] ED by Case manager to assist with re applying for medicaid and getting new pcp in [place] .      Follow up with [facility].   Why:  Go to the walk-in clinic between 8 and  3 M-F for your hospital follow up appointment. This is mental health where you will see a psychitrist.   Contact information:   [place] [phone]      Schedule an appointment as soon as possible for a visit with [facility]    .   Why:  need a follow up with hepatitis diagnosis   Contact information:   [place]  [address] [phone]      Plan Of Care/Follow-up recommendations:  Activity:  as above Diet:  regular Follow up as above Is patient on multiple antipsychotic therapies at discharge:  No   Has Patient had three or more failed trials of antipsychotic monotherapy by history:  No  Recommended Plan for Multiple Antipsychotic Therapies: NA    Zohaib Heeney A 12/13/2014, 1:32 PM

## 2014-12-13 NOTE — Progress Notes (Signed)
Pt d/c with a taxi voucher and a bus pass with map. All items returned. D/C instructions given, prescriptions given, and samples given. Pt denies si and hi.

## 2015-01-06 ENCOUNTER — Emergency Department (HOSPITAL_COMMUNITY)
Admission: EM | Admit: 2015-01-06 | Discharge: 2015-01-06 | Disposition: A | Discharge status: Home / Self Care | Payer: Self-pay | Attending: Emergency Medicine | Admitting: Emergency Medicine

## 2015-01-06 ENCOUNTER — Encounter (HOSPITAL_COMMUNITY): Payer: Self-pay | Admitting: Emergency Medicine

## 2015-01-06 ENCOUNTER — Emergency Department (HOSPITAL_COMMUNITY): Payer: Self-pay

## 2015-01-06 DIAGNOSIS — M545 Low back pain: Secondary | ICD-10-CM

## 2015-01-06 DIAGNOSIS — I10 Essential (primary) hypertension: Secondary | ICD-10-CM | POA: Insufficient documentation

## 2015-01-06 DIAGNOSIS — R52 Pain, unspecified: Secondary | ICD-10-CM

## 2015-01-06 DIAGNOSIS — M5440 Lumbago with sciatica, unspecified side: Secondary | ICD-10-CM | POA: Insufficient documentation

## 2015-01-06 DIAGNOSIS — Z79899 Other long term (current) drug therapy: Secondary | ICD-10-CM | POA: Insufficient documentation

## 2015-01-06 DIAGNOSIS — Z720 Tobacco use: Secondary | ICD-10-CM | POA: Insufficient documentation

## 2015-01-06 DIAGNOSIS — F209 Schizophrenia, unspecified: Secondary | ICD-10-CM | POA: Insufficient documentation

## 2015-01-06 DIAGNOSIS — M549 Dorsalgia, unspecified: Secondary | ICD-10-CM

## 2015-01-06 MED ORDER — NAPROXEN 250 MG PO TABS: 250.0000 mg | ORAL_TABLET | Freq: Two times a day (BID) | ORAL | Status: DC

## 2015-01-06 NOTE — ED Notes (Addendum)
Patient c/o mid back pain, low thoracic to lumbar. Patient states he fell @ 2 weeks ago (in august), states his back has bothered him since then. Patient states he was referred to a chiropractor by his lawyer but was advised not to go for social security office. Patient states he can not take the pain. Patient denies taking any medications at home for the pain. Patient states he needs a drs note in order to use heat packs as he is a resident at [facility]. Patient states last night he took a percoet given to him by his girlfriend which did help.

## 2015-01-06 NOTE — Discharge Instructions (Signed)

## 2015-01-06 NOTE — ED Provider Notes (Signed)
CSN: [id number]     Arrival date & time 01/06/15  8182 History   First MD Initiated Contact with Patient 01/06/15 1009     Chief Complaint  Patient presents with  . Back Pain   Ronnie Burton is a 54 y.o. male with a history of hypertension and schizophrenia who presents to the emergency department complaining of mid and lower back pain after he slipped and fell two weeks ago. He patient reports he slipped on water and fell on his right side. Patient reports he's had back pain since. Patient reports he's been using a heating pad at night which totally relieved his pain. Patient reports is requesting a note to have bed rest for his back pain. Patient reports he has tingling in the bottom of his right foot. He complains of 8 out of 10 mid and low back pain that is worse with movement. The patient reports only using heating pads for treatment of his pain at home. The patient denies fevers, chills, recent illness, history of cancer, history of IV drug use, loss of bladder control, loss of bowel control, weight loss, urinary symptoms, abdominal pain, nausea, vomiting, numbness or weakness.  (Consider location/radiation/quality/duration/timing/severity/associated sxs/prior Treatment) HPI  Past Medical History  Diagnosis Date  . Hypertension   . Schizophrenia    History reviewed. No pertinent past surgical history. Family History  Problem Relation Age of Onset  . Alcoholism Father    Social History  Substance Use Topics  . Smoking status: Current Every Day Smoker -- 0.25 packs/day    Types: Cigarettes  . Smokeless tobacco: Never Used  . Alcohol Use: No     Comment: occ    Review of Systems  Constitutional: Negative for fever and chills.  Eyes: Negative for visual disturbance.  Respiratory: Negative for cough, shortness of breath and wheezing.   Cardiovascular: Negative for chest pain.  Gastrointestinal: Negative for nausea, vomiting and abdominal pain.  Genitourinary: Negative for  dysuria, frequency, hematuria and difficulty urinating.  Musculoskeletal: Positive for back pain. Negative for gait problem and neck pain.  Skin: Negative for rash and wound.  Neurological: Negative for dizziness, syncope, weakness, light-headedness, numbness and headaches.      Allergies  Review of patient's allergies indicates no known allergies.  Home Medications   Prior to Admission medications   Medication Sig Start Date End Date Taking? Authorizing Provider  hydrOXYzine (ATARAX/VISTARIL) 50 MG tablet Take 1 tablet (50 mg total) by mouth 3 (three) times daily as needed for anxiety. 12/13/14   Encarnacion Slates, NP  naproxen (NAPROSYN) 250 MG tablet Take 1 tablet (250 mg total) by mouth 2 (two) times daily with a meal. 01/06/15   Waynetta Pean, PA-C  nicotine (NICODERM CQ - DOSED IN MG/24 HOURS) 21 mg/24hr patch Place 1 patch (21 mg total) onto the skin daily. For nicotine dependence 12/13/14   Encarnacion Slates, NP  QUEtiapine (SEROQUEL) 50 MG tablet Take 1 tablet (50 mg) twice daily & 3 tablets (150 mg) at bedtime: For mood control 12/13/14   Encarnacion Slates, NP  traZODone (DESYREL) 150 MG tablet Take 1 tablet (150 mg total) by mouth at bedtime. For sleep 12/13/14   Encarnacion Slates, NP   BP 119/70 mmHg  Pulse 73  Temp(Src) 97.7 F (36.5 C) (Oral)  Resp 20  SpO2 100% Physical Exam  Constitutional: He is oriented to person, place, and time. He appears well-developed and well-nourished. No distress.  Nontoxic appearing.  HENT:  Head: Normocephalic and atraumatic.  Eyes: Conjunctivae are normal. Pupils are equal, round, and reactive to light. Right eye exhibits no discharge. Left eye exhibits no discharge.  Neck: Normal range of motion. Neck supple.  Cardiovascular: Normal rate, regular rhythm, normal heart sounds and intact distal pulses.   Pulmonary/Chest: Effort normal and breath sounds normal. No respiratory distress. He has no wheezes. He has no rales.  Abdominal: Soft. There is no  tenderness. There is no guarding.  Musculoskeletal: He exhibits tenderness. He exhibits no edema.  Patient has 5 out of 5 strength in his bilateral upper and lower extremities. He is able to ambulate in the room without difficulty or assistance and with normal gait. Patient has tenderness diffusely across his mid and low back. No back edema, deformity, ecchymosis or erythema.  Lymphadenopathy:    He has no cervical adenopathy.  Neurological: He is alert and oriented to person, place, and time. He has normal reflexes. He displays normal reflexes. Coordination normal.  Patient is alert and oriented 3. Sensation is intact in his bilateral upper and lower extremities. He is able to ambulate with normal gait.  Skin: Skin is warm and dry. No rash noted. He is not diaphoretic. No erythema. No pallor.  Psychiatric: He has a normal mood and affect. His behavior is normal.  Nursing note and vitals reviewed.   ED Course  Procedures (including critical care time) Labs Review Labs Reviewed - No data to display  Imaging Review Dg Thoracic Spine W/swimmers  01/06/2015   CLINICAL DATA:  Fall 2 weeks ago.  Mid upper back pain.  EXAM: THORACIC SPINE - 3 VIEWS  COMPARISON:  None.  FINDINGS: Lower cervical spondylosis and degenerative disc disease on the swimmer's view.  No thoracic spine fracture or malalignment is identified.  IMPRESSION: 1. No acute thoracic spine findings. 2. Lower cervical spondylosis and degenerative disc disease.   Electronically Signed   By: Van Clines M.D.   On: 01/06/2015 10:55   Dg Lumbar Spine Complete  01/06/2015   CLINICAL DATA:  Fall 2 weeks ago. Low back pain radiating to the right leg.  EXAM: LUMBAR SPINE - COMPLETE 4+ VIEW  COMPARISON:  None.  FINDINGS: Degenerative facet arthropathy at L4-5 and L5-S1 bilaterally.  No lumbar spine fracture or malalignment noted. No acute lumbar abnormality is observed.  IMPRESSION: 1. No acute findings. 2. Lower lumbar degenerative facet  arthropathy.   Electronically Signed   By: Van Clines M.D.   On: 01/06/2015 11:07      EKG Interpretation None      Filed Vitals:   01/06/15 0834  BP: 119/70  Pulse: 73  Temp: 97.7 F (36.5 C)  TempSrc: Oral  Resp: 20  SpO2: 100%     MDM   Meds given in ED:  Medications - No data to display  New Prescriptions   NAPROXEN (NAPROSYN) 250 MG TABLET    Take 1 tablet (250 mg total) by mouth 2 (two) times daily with a meal.    Final diagnoses:  Bilateral low back pain, with sciatica presence unspecified  Mid back pain   This is a 54 y.o. male with a history of hypertension and schizophrenia who presents to the emergency department complaining of mid and lower back pain after he slipped and fell two weeks ago. He patient reports he slipped on water and fell on his right side. Patient reports he's had back pain since. Patient reports he's been using a heating pad at night which totally relieved his pain.  On  exam the patient is afebrile nontoxic appearing. No neurological deficits and normal neuro exam.  Patient can ambulate without difficulty or assistance.  No loss of bowel or bladder control.  No concern for cauda equina.  No fever, night sweats, weight loss, h/o cancer, IVDU.  X-rays were obtained of his thoracic and lumbar spine due to the patient's history off fall two weeks ago. X-rays were unremarkable. I encouraged back exercises and strengthening. Will discharge with a short course of naproxen encouraged to continue using heating pads if this relieves his pain.RICE protocol and pain medicine indicated and discussed with patient. I advised the patient to follow-up with their primary care provider this week. I advised the patient to return to the emergency department with new or worsening symptoms or new concerns. The patient verbalized understanding and agreement with plan.    Waynetta Pean, PA-C 01/06/15 1124  Serita Grit, MD 01/07/15 1345

## 2015-02-16 ENCOUNTER — Emergency Department (HOSPITAL_COMMUNITY)
Admission: EM | Admit: 2015-02-16 | Discharge: 2015-02-16 | Disposition: A | Discharge status: Home / Self Care | Payer: Self-pay | Attending: Emergency Medicine | Admitting: Emergency Medicine

## 2015-02-16 ENCOUNTER — Encounter (HOSPITAL_COMMUNITY): Payer: Self-pay | Admitting: Emergency Medicine

## 2015-02-16 DIAGNOSIS — F209 Schizophrenia, unspecified: Secondary | ICD-10-CM | POA: Insufficient documentation

## 2015-02-16 DIAGNOSIS — I10 Essential (primary) hypertension: Secondary | ICD-10-CM | POA: Insufficient documentation

## 2015-02-16 DIAGNOSIS — Z791 Long term (current) use of non-steroidal anti-inflammatories (NSAID): Secondary | ICD-10-CM | POA: Insufficient documentation

## 2015-02-16 DIAGNOSIS — L0390 Cellulitis, unspecified: Secondary | ICD-10-CM

## 2015-02-16 DIAGNOSIS — Z79899 Other long term (current) drug therapy: Secondary | ICD-10-CM | POA: Insufficient documentation

## 2015-02-16 DIAGNOSIS — Z720 Tobacco use: Secondary | ICD-10-CM | POA: Insufficient documentation

## 2015-02-16 DIAGNOSIS — L0291 Cutaneous abscess, unspecified: Secondary | ICD-10-CM

## 2015-02-16 DIAGNOSIS — L02414 Cutaneous abscess of left upper limb: Secondary | ICD-10-CM | POA: Insufficient documentation

## 2015-02-16 MED ORDER — LIDOCAINE HCL (PF) 1 % IJ SOLN
5.0000 mL | Freq: Once | INTRAMUSCULAR | Status: AC
  Administered 2015-02-16: 5 mL via INTRADERMAL
  Filled 2015-02-16: qty 5

## 2015-02-16 MED ORDER — CEPHALEXIN 500 MG PO CAPS: 500.0000 mg | ORAL_CAPSULE | Freq: Four times a day (QID) | ORAL | Status: DC

## 2015-02-16 NOTE — ED Notes (Signed)
Patient states L arm abscess that started yesterday after patient had been injecting heroin.   Patient states he "popped it myself with a razor last night, but I didn't get it all".

## 2015-02-16 NOTE — ED Provider Notes (Signed)
CSN: [id number]     Arrival date & time 02/16/15  1105 History  By signing my name below, I, Erling Conte, attest that this documentation has been prepared under the direction and in the presence of Gloriann Loan, PA-C Electronically Signed: Erling Conte, ED Scribe. 02/16/2015. 1:32 PM.    Chief Complaint  Patient presents with  . Abscess    injected heroin at site   The history is provided by the patient. No language interpreter was used.    HPI Comments: Ronnie Burton is a 54 y.o. male who presents to the Emergency Department complaining of an abscess to his left arm that began yesterday after pt has been injecting heroin. He states this is the arm that he typically injects into. Pt reports associated diaphoresis last night; he states this is atypical of his symptoms when using heroin. He endorses positive drainage from the abscess and states the drainage has been purulent with small amounts of blood. He has not taken any medications or tried any treatments PTA. His last heroin use was yesterday. He denies any fever, chills, numbness or tingling in his left hand, nausea, vomiting, chest pain or SOB.  Past Medical History  Diagnosis Date  . Hypertension   . Schizophrenia ([place])    History reviewed. No pertinent past surgical history. Family History  Problem Relation Age of Onset  . Alcoholism Father    Social History  Substance Use Topics  . Smoking status: Current Every Day Smoker -- 0.25 packs/day    Types: Cigarettes  . Smokeless tobacco: Never Used  . Alcohol Use: No     Comment: occ    Review of Systems 10 Systems reviewed and all are negative for acute change except as noted in the HPI.     Allergies  Review of patient's allergies indicates no known allergies.  Home Medications   Prior to Admission medications   Medication Sig Start Date End Date Taking? Authorizing Provider  cephALEXin (KEFLEX) 500 MG capsule Take 1 capsule (500 mg total) by mouth 4 (four)  times daily. 02/16/15   Gloriann Loan, PA-C  hydrOXYzine (ATARAX/VISTARIL) 50 MG tablet Take 1 tablet (50 mg total) by mouth 3 (three) times daily as needed for anxiety. 12/13/14   Encarnacion Slates, NP  naproxen (NAPROSYN) 250 MG tablet Take 1 tablet (250 mg total) by mouth 2 (two) times daily with a meal. 01/06/15   Waynetta Pean, PA-C  nicotine (NICODERM CQ - DOSED IN MG/24 HOURS) 21 mg/24hr patch Place 1 patch (21 mg total) onto the skin daily. For nicotine dependence 12/13/14   Encarnacion Slates, NP  QUEtiapine (SEROQUEL) 50 MG tablet Take 1 tablet (50 mg) twice daily & 3 tablets (150 mg) at bedtime: For mood control 12/13/14   Encarnacion Slates, NP  traZODone (DESYREL) 150 MG tablet Take 1 tablet (150 mg total) by mouth at bedtime. For sleep 12/13/14   Encarnacion Slates, NP   Triage Vitals: BP 133/79 mmHg  Pulse 88  Temp(Src) 98.2 F (36.8 C) (Oral)  Resp 16  SpO2 100%  Physical Exam  Constitutional: He is oriented to person, place, and time. He appears well-developed and well-nourished. No distress.  HENT:  Head: Normocephalic and atraumatic.  Eyes: Conjunctivae and EOM are normal.  Neck: Neck supple. No tracheal deviation present.  Cardiovascular: Normal rate and intact distal pulses.   Pulmonary/Chest: Effort normal. No respiratory distress.  Musculoskeletal: Normal range of motion.  Full passive ROM of left elbow.  Limited active  ROM.  Neurological: He is alert and oriented to person, place, and time.  Skin: Skin is warm and dry.  Induration and fluctuant lesion on left elbow with overlying erythema.  Active drainage, pus, nonbloody.  Area TTP.   Psychiatric: He has a normal mood and affect. His behavior is normal.  Nursing note and vitals reviewed.   ED Course  Procedures (including critical care time)  INCISION AND DRAINAGE Performed by: Gloriann Loan Consent: Verbal consent obtained. Risks and benefits: risks, benefits and alternatives were discussed Type: abscess  Body area: left medial  elbow above antecubital fossa  Anesthesia: local infiltration  Incision was made with a scalpel.  Local anesthetic: lidocaine 1% without epinephrine  Anesthetic total: 10 ml  Complexity: complex Blunt dissection to break up loculations  Drainage: purulent  Drainage amount: copious  Packing material: none  Patient tolerance: Patient tolerated the procedure well with no immediate complications.     DIAGNOSTIC STUDIES: Oxygen Saturation is 100% on RA, normal by my interpretation.    COORDINATION OF CARE:    Labs Review Labs Reviewed - No data to display  Imaging Review No results found. I have personally reviewed and evaluated these images and lab results as part of my medical decision-making.   EKG Interpretation None      MDM   Final diagnoses:  Abscess and cellulitis    Patient presents with abscess and overlying cellulitis.  Denies fevers, chills, N/V/, abdominal pain.  VSS.  Doubt joint involvement.  Will perform I&D.  Will d/c home Keflex 500 mg QID x 5 days.  Discussed strict return precautions to ED. Patient agrees and acknowledges the above plan.   I personally performed the services described in this documentation, which was scribed in my presence. The recorded information has been reviewed and is accurate.       Gloriann Loan, PA-C 02/16/15 [place], MD 02/17/15 [phone]

## 2015-02-16 NOTE — ED Notes (Signed)
Pt is in stable condition upon d/c and ambulates from ED. 

## 2015-02-16 NOTE — Discharge Instructions (Signed)
Abscess An abscess (boil or furuncle) is an infected area on or under the skin. This area is filled with yellowish-white fluid (pus) and other material (debris). HOME CARE   Only take medicines as told by your doctor.  If you were given antibiotic medicine, take it as directed. Finish the medicine even if you start to feel better.  If gauze is used, follow your doctor's directions for changing the gauze.  To avoid spreading the infection:  Keep your abscess covered with a bandage.  Wash your hands well.  Do not share personal care items, towels, or whirlpools with others.  Avoid skin contact with others.  Keep your skin and clothes clean around the abscess.  Keep all doctor visits as told. GET HELP RIGHT AWAY IF:   You have more pain, puffiness (swelling), or redness in the wound site.  You have more fluid or blood coming from the wound site.  You have muscle aches, chills, or you feel sick.  You have a fever. MAKE SURE YOU:   Understand these instructions.  Will watch your condition.  Will get help right away if you are not doing well or get worse.   This information is not intended to replace advice given to you by your health care provider. Make sure you discuss any questions you have with your health care provider.   Document Released: 09/26/2007 Document Revised: 10/09/2011 Document Reviewed: 06/23/2011 Elsevier Interactive Patient Education 2016 [employer]   Cellulitis Cellulitis is an infection of the skin and the tissue under the skin. The infected area is usually red and tender. This happens most often in the arms and lower legs. HOME CARE   Take your antibiotic medicine as told. Finish the medicine even if you start to feel better.  Keep the infected arm or leg raised (elevated).  Put a warm cloth on the area up to 4 times per day.  Only take medicines as told by your doctor.  Keep all doctor visits as told. GET HELP IF:  You see red streaks  on the skin coming from the infected area.  Your red area gets bigger or turns a dark color.  Your bone or joint under the infected area is painful after the skin heals.  Your infection comes back in the same area or different area.  You have a puffy (swollen) bump in the infected area.  You have new symptoms.  You have a fever. GET HELP RIGHT AWAY IF:   You feel very sleepy.  You throw up (vomit) or have watery poop (diarrhea).  You feel sick and have muscle aches and pains.   This information is not intended to replace advice given to you by your health care provider. Make sure you discuss any questions you have with your health care provider.   Document Released: 09/26/2007 Document Revised: 12/29/2014 Document Reviewed: 06/25/2011 Elsevier Interactive Patient Education [employer].

## 2015-02-23 ENCOUNTER — Observation Stay (HOSPITAL_COMMUNITY)
Admission: EM | Admit: 2015-02-23 | Discharge: 2015-02-24 | Disposition: A | Discharge status: Home / Self Care | Payer: Self-pay | Attending: Emergency Medicine | Admitting: Emergency Medicine

## 2015-02-23 ENCOUNTER — Observation Stay (HOSPITAL_COMMUNITY): Payer: Self-pay

## 2015-02-23 ENCOUNTER — Emergency Department (HOSPITAL_COMMUNITY): Payer: Self-pay

## 2015-02-23 ENCOUNTER — Encounter (HOSPITAL_COMMUNITY): Payer: Self-pay | Admitting: Emergency Medicine

## 2015-02-23 DIAGNOSIS — R0602 Shortness of breath: Secondary | ICD-10-CM | POA: Insufficient documentation

## 2015-02-23 DIAGNOSIS — Z720 Tobacco use: Secondary | ICD-10-CM | POA: Insufficient documentation

## 2015-02-23 DIAGNOSIS — F209 Schizophrenia, unspecified: Secondary | ICD-10-CM | POA: Insufficient documentation

## 2015-02-23 DIAGNOSIS — B9689 Other specified bacterial agents as the cause of diseases classified elsewhere: Secondary | ICD-10-CM

## 2015-02-23 DIAGNOSIS — F319 Bipolar disorder, unspecified: Secondary | ICD-10-CM | POA: Insufficient documentation

## 2015-02-23 DIAGNOSIS — R110 Nausea: Secondary | ICD-10-CM | POA: Insufficient documentation

## 2015-02-23 DIAGNOSIS — F1910 Other psychoactive substance abuse, uncomplicated: Secondary | ICD-10-CM | POA: Insufficient documentation

## 2015-02-23 DIAGNOSIS — F250 Schizoaffective disorder, bipolar type: Secondary | ICD-10-CM | POA: Diagnosis present

## 2015-02-23 DIAGNOSIS — L02414 Cutaneous abscess of left upper limb: Secondary | ICD-10-CM

## 2015-02-23 DIAGNOSIS — F17210 Nicotine dependence, cigarettes, uncomplicated: Secondary | ICD-10-CM | POA: Diagnosis present

## 2015-02-23 DIAGNOSIS — B1920 Unspecified viral hepatitis C without hepatic coma: Secondary | ICD-10-CM | POA: Diagnosis present

## 2015-02-23 DIAGNOSIS — R062 Wheezing: Secondary | ICD-10-CM | POA: Insufficient documentation

## 2015-02-23 DIAGNOSIS — R079 Chest pain, unspecified: Secondary | ICD-10-CM | POA: Insufficient documentation

## 2015-02-23 DIAGNOSIS — Z792 Long term (current) use of antibiotics: Secondary | ICD-10-CM | POA: Insufficient documentation

## 2015-02-23 DIAGNOSIS — I10 Essential (primary) hypertension: Secondary | ICD-10-CM | POA: Insufficient documentation

## 2015-02-23 DIAGNOSIS — R0789 Other chest pain: Principal | ICD-10-CM

## 2015-02-23 DIAGNOSIS — R002 Palpitations: Secondary | ICD-10-CM | POA: Insufficient documentation

## 2015-02-23 HISTORY — DX: Headache, unspecified: R51.9

## 2015-02-23 HISTORY — DX: Bipolar disorder, unspecified: F31.9

## 2015-02-23 HISTORY — DX: Headache: R51

## 2015-02-23 HISTORY — DX: Cardiac murmur, unspecified: R01.1

## 2015-02-23 LAB — TROPONIN I: Troponin I: <0.03 ng/mL (ref <0.031)

## 2015-02-23 LAB — BASIC METABOLIC PANEL
ANION GAP: 9 (ref 5–15)
BUN: 12 mg/dL (ref 6–20)
CALCIUM: 9.1 mg/dL (ref 8.9–10.3)
CHLORIDE: 100 mmol/L — AB (ref 101–111)
CO2: 24 mmol/L (ref 22–32)
Creatinine, Ser: 1.2 mg/dL (ref 0.61–1.24)
GFR calc non Af Amer: >60 mL/min (ref >60)
GLUCOSE: 108 mg/dL — AB (ref 65–99)
POTASSIUM: 3.8 mmol/L (ref 3.5–5.1)
Sodium: 133 mmol/L — ABNORMAL LOW (ref 135–145)

## 2015-02-23 LAB — LIPID PANEL
CHOL/HDL RATIO: 3.6 ratio
CHOLESTEROL: 124 mg/dL (ref 0–200)
HDL: 34 mg/dL — AB (ref >40)
LDL Cholesterol: 37 mg/dL (ref 0–99)
Triglycerides: 265 mg/dL — ABNORMAL HIGH (ref <150)
VLDL: 53 mg/dL — AB (ref 0–40)

## 2015-02-23 LAB — HEPATIC FUNCTION PANEL
ALT: 41 U/L (ref 17–63)
AST: 42 U/L — ABNORMAL HIGH (ref 15–41)
Albumin: 3 g/dL — ABNORMAL LOW (ref 3.5–5.0)
Alkaline Phosphatase: 65 U/L (ref 38–126)
BILIRUBIN DIRECT: 0.1 mg/dL (ref 0.1–0.5)
BILIRUBIN INDIRECT: 0.6 mg/dL (ref 0.3–0.9)
BILIRUBIN TOTAL: 0.7 mg/dL (ref 0.3–1.2)
Total Protein: 7 g/dL (ref 6.5–8.1)

## 2015-02-23 LAB — RAPID URINE DRUG SCREEN, HOSP PERFORMED
Amphetamines: NOT DETECTED
Barbiturates: NOT DETECTED
Benzodiazepines: NOT DETECTED
COCAINE: NOT DETECTED
OPIATES: POSITIVE — AB
Tetrahydrocannabinol: NOT DETECTED

## 2015-02-23 LAB — CBC
HEMATOCRIT: 39.5 % (ref 39.0–52.0)
HEMOGLOBIN: 13.3 g/dL (ref 13.0–17.0)
MCH: 31.2 pg (ref 26.0–34.0)
MCHC: 33.7 g/dL (ref 30.0–36.0)
MCV: 92.7 fL (ref 78.0–100.0)
Platelets: 196 10*3/uL (ref 150–400)
RBC: 4.26 MIL/uL (ref 4.22–5.81)
RDW: 12.1 % (ref 11.5–15.5)
WBC: 11 10*3/uL — ABNORMAL HIGH (ref 4.0–10.5)

## 2015-02-23 LAB — I-STAT TROPONIN, ED: TROPONIN I, POC: 0 ng/mL (ref 0.00–0.08)

## 2015-02-23 MED ORDER — HYDROXYZINE HCL 25 MG PO TABS: 50.0000 mg | ORAL_TABLET | Freq: Three times a day (TID) | ORAL | Status: DC | PRN

## 2015-02-23 MED ORDER — KETOROLAC TROMETHAMINE 30 MG/ML IJ SOLN: 30.0000 mg | Freq: Once | INTRAMUSCULAR | Status: DC

## 2015-02-23 MED ORDER — IOHEXOL 350 MG/ML SOLN
100.0000 mL | Freq: Once | INTRAVENOUS | Status: AC | PRN
  Administered 2015-02-23: 100 mL via INTRAVENOUS

## 2015-02-23 MED ORDER — GI COCKTAIL ~~LOC~~
30.0000 mL | Freq: Four times a day (QID) | ORAL | Status: DC | PRN
  Administered 2015-02-23: 30 mL via ORAL
  Filled 2015-02-23: qty 30

## 2015-02-23 MED ORDER — MORPHINE SULFATE (PF) 2 MG/ML IV SOLN
1.0000 mg | INTRAVENOUS | Status: DC | PRN
  Filled 2015-02-23: qty 1

## 2015-02-23 MED ORDER — ACETAMINOPHEN 500 MG PO TABS: 500.0000 mg | ORAL_TABLET | Freq: Four times a day (QID) | ORAL | Status: DC | PRN

## 2015-02-23 MED ORDER — GI COCKTAIL ~~LOC~~
30.0000 mL | Freq: Once | ORAL | Status: AC
  Administered 2015-02-23: 30 mL via ORAL
  Filled 2015-02-23: qty 30

## 2015-02-23 MED ORDER — INSULIN ASPART 100 UNIT/ML ~~LOC~~ SOLN: 0.0000 [IU] | Freq: Three times a day (TID) | SUBCUTANEOUS | Status: DC

## 2015-02-23 MED ORDER — MORPHINE SULFATE (PF) 2 MG/ML IV SOLN
2.0000 mg | Freq: Once | INTRAVENOUS | Status: AC
  Administered 2015-02-23: 2 mg via INTRAVENOUS
  Filled 2015-02-23: qty 1

## 2015-02-23 MED ORDER — ACETAMINOPHEN 325 MG PO TABS: 650.0000 mg | ORAL_TABLET | ORAL | Status: DC | PRN

## 2015-02-23 MED ORDER — QUETIAPINE FUMARATE 50 MG PO TABS
50.0000 mg | ORAL_TABLET | Freq: Two times a day (BID) | ORAL | Status: DC
  Administered 2015-02-23 – 2015-02-24 (×3): 50 mg via ORAL
  Filled 2015-02-23 (×3): qty 1

## 2015-02-23 MED ORDER — TRAZODONE HCL 50 MG PO TABS
150.0000 mg | ORAL_TABLET | Freq: Every day | ORAL | Status: DC
  Administered 2015-02-23: 150 mg via ORAL
  Filled 2015-02-23 (×2): qty 1

## 2015-02-23 MED ORDER — QUETIAPINE FUMARATE 50 MG PO TABS
150.0000 mg | ORAL_TABLET | Freq: Every day | ORAL | Status: DC
  Administered 2015-02-23: 150 mg via ORAL
  Filled 2015-02-23: qty 3

## 2015-02-23 MED ORDER — ENOXAPARIN SODIUM 40 MG/0.4ML ~~LOC~~ SOLN
40.0000 mg | SUBCUTANEOUS | Status: DC
  Administered 2015-02-23: 40 mg via SUBCUTANEOUS
  Filled 2015-02-23: qty 0.4

## 2015-02-23 MED ORDER — MORPHINE SULFATE (PF) 2 MG/ML IV SOLN
1.0000 mg | INTRAVENOUS | Status: DC | PRN
  Administered 2015-02-23: 1 mg via INTRAVENOUS
  Filled 2015-02-23: qty 1

## 2015-02-23 MED ORDER — ALBUTEROL SULFATE (2.5 MG/3ML) 0.083% IN NEBU: 2.5000 mg | INHALATION_SOLUTION | RESPIRATORY_TRACT | Status: DC | PRN

## 2015-02-23 MED ORDER — ONDANSETRON HCL 4 MG/2ML IJ SOLN: 4.0000 mg | Freq: Four times a day (QID) | INTRAMUSCULAR | Status: DC | PRN

## 2015-02-23 MED ORDER — IPRATROPIUM-ALBUTEROL 0.5-2.5 (3) MG/3ML IN SOLN
3.0000 mL | Freq: Once | RESPIRATORY_TRACT | Status: AC
  Administered 2015-02-23: 3 mL via RESPIRATORY_TRACT
  Filled 2015-02-23: qty 3

## 2015-02-23 MED ORDER — ONDANSETRON HCL 4 MG/2ML IJ SOLN
4.0000 mg | INTRAMUSCULAR | Status: AC
  Administered 2015-02-23: 4 mg via INTRAVENOUS
  Filled 2015-02-23: qty 2

## 2015-02-23 NOTE — H&P (Signed)
Date: 02/23/2015               Patient Name:  Ronnie Burton MRN: [id number]  DOB: [birth year] Age / Sex: 54 y.o., male   PCP: No primary care provider on file.           Medical Service: Internal Medicine Teaching Service         Attending Physician: Dr. Campbell Riches, MD    First Contact: Dr. Juleen China, MD Pager: [phone]  Second Contact: Dr. Arcelia Jew, MD Pager: [phone] (7AM-5PM Mon-Fri)       After Hours (After 5p/  First Contact Pager: [phone]  weekends / holidays): Second Contact Pager: [phone]    Most Recent Discharge Date:  02/16/15  Chief Complaint:  Chief Complaint  Patient presents with  . Chest Pain       History of Present Illness:  Ronnie Burton is a 54 y.o. male who has a past medical history of Hypertension; Schizophrenia ([place]); and Bipolar 1 disorder ([place]).  He moved to [place] from [place] about 4 months ago to be with family (wife has family here).    Pt presents to the ED with dyspnea that began last night around 11:30PM while lying in bed.  He went to bed and then woke up this AM with left sided non-radiating chest pain around 5:30AM.  Reports the chest pain felt like something "sitting on his chest."  Reports the pain was continuous for several hours.  Endorses associated dyspnea, nausea, lightheadedness, diaphoresis.  Denies ever experiencing chest pain with exertion.  Reports having a chronic cough but denies hemoptysis or LE swelling.  No h/o DVT/PE.  He reports occasional reflex symptoms.  Denies any recent heavy lifting or exercise.  Is currently not working but worked in [employer] for 30 yrs.  Reports having a "small heart attack" while in [place] at age 29 and states the pain feels similar.  Also reports having a nuclear stress test in 2010 while living in [place] which was normal.  Smokes ~6cig/day for the past 20+ yrs.  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  Denies personal history of HTN, dyslipidemia, DM.  Denies FH of  CAD.  He has a h/o anxiety currently on trazadone, seroquel, and hydroxyzine.    Prior to arrival to the ED, pt was given 324mg  ASA and one NTG in route by EMS.  Initial troponin neg and EKG without acute changes from prior.   Of note, he had a admission to behavioral health in August 2016 for suicidal ideations and auditory hallucinations to kill himself.  Apparently had an admission while in [place] for cutting his wrist.    Meds: Current Facility-Administered Medications  Medication Dose Route Frequency Provider Last Rate Last Dose  . acetaminophen (TYLENOL) tablet 650 mg  650 mg Oral Q4H PRN Jones Bales, MD      . albuterol (PROVENTIL) (2.5 MG/3ML) 0.083% nebulizer solution 2.5 mg  2.5 mg Nebulization Q2H PRN Jones Bales, MD      . enoxaparin (LOVENOX) injection 40 mg  40 mg Subcutaneous Q24H Jones Bales, MD      . gi cocktail (Maalox,Lidocaine,Donnatal)  30 mL Oral QID PRN Jones Bales, MD      . hydrOXYzine (ATARAX/VISTARIL) tablet 50 mg  50 mg Oral TID PRN Jones Bales, MD      . morphine 2 MG/ML injection 1 mg  1 mg Intravenous Q3H PRN Jones Bales, MD      .  ondansetron (ZOFRAN) injection 4 mg  4 mg Intravenous Q6H PRN Jones Bales, MD      . QUEtiapine (SEROQUEL) tablet 150 mg  150 mg Oral QHS Jones Bales, MD      . QUEtiapine (SEROQUEL) tablet 50 mg  50 mg Oral BID Jones Bales, MD      . traZODone (DESYREL) tablet 150 mg  150 mg Oral QHS Jones Bales, MD        Prescriptions prior to admission  Medication Sig Dispense Refill Last Dose  . hydrOXYzine (ATARAX/VISTARIL) 50 MG tablet Take 1 tablet (50 mg total) by mouth 3 (three) times daily as needed for anxiety. 45 tablet 0 Past Week at Unknown time  . QUEtiapine (SEROQUEL) 50 MG tablet Take 1 tablet (50 mg) twice daily & 3 tablets (150 mg) at bedtime: For mood control 150 tablet 0 02/22/2015 at Unknown time  . traZODone (DESYREL) 150 MG tablet Take 1 tablet (150 mg total) by mouth at bedtime.  For sleep 30 tablet 0 02/22/2015 at Unknown time  . cephALEXin (KEFLEX) 500 MG capsule Take 1 capsule (500 mg total) by mouth 4 (four) times daily. 20 capsule 0 02/22/15    Allergies: Allergies as of 02/23/2015  . (No Known Allergies)    PMH: Past Medical History  Diagnosis Date  . Hypertension   . Schizophrenia ([place])   . Bipolar 1 disorder (H[facility])     PSH: History reviewed. No pertinent past surgical history.  FH: Family History  Problem Relation Age of Onset  . Alcoholism Father     SH: Social History  Substance Use Topics  . Smoking status: Current Every Day Smoker -- 0.25 packs/day    Types: Cigarettes  . Smokeless tobacco: Never Used  . Alcohol Use: No     Comment: occ    Review of Systems: Pertinent items are noted in HPI.  Physical Exam: BP 129/69 mmHg  Pulse 55  Temp(Src) 98.4 F (36.9 C) (Oral)  Resp 19  Ht 6\' 5"  (1.956 m)  Wt 195 lb 11.2 oz (88.769 kg)  BMI 23.20 kg/m2  SpO2 100%  Physical Exam  Constitutional: Vital signs reviewed.  Patient is a well-developed male in no acute distress and cooperative with exam.  Head: Normocephalic and atraumatic Eyes: EOMI, conjunctivae normal, No scleral icterus.  Neck: Supple, Trachea midline.  Cardiovascular: RRR, no MRG. Pulmonary/Chest: normal respiratory effort, diffuse expiratory wheezes.  Tenderness to palpation in the left chest area and substernal.   Abdominal: Soft. Non-tender, non-distended, bowel sounds are normal. Neurological: A&O x3, cranial nerve II-XII are grossly intact, no focal motor deficit noted. Skin: Warm, dry and intact. No rash. Psychiatric: Normal mood and affect.    Lab results:  Basic Metabolic Panel:  Recent Labs  02/23/15 0658  NA 133*  K 3.8  CL 100*  CO2 24  GLUCOSE 108*  BUN 12  CREATININE 1.20  CALCIUM 9.1    Calcium/Magnesium/Phosphorus:  Recent Labs Lab 02/23/15 0658  CALCIUM 9.1    Liver Function Tests: No results for input(s): AST, ALT, ALKPHOS,  BILITOT, PROT, ALBUMIN in the last 72 hours. No results for input(s): LIPASE, AMYLASE in the last 72 hours. No results for input(s): AMMONIA in the last 72 hours.  CBC: Lab Results  Component Value Date   WBC 11.0* 02/23/2015   HGB 13.3 02/23/2015   HCT 39.5 02/23/2015   MCV 92.7 02/23/2015   PLT 196 02/23/2015    Lipase: No results found for: LIPASE  Lactic Acid/Procalcitonin: No results for input(s): LATICACIDVEN, PROCALCITON, O2SATVEN in the last 168 hours.  Cardiac Enzymes:  Recent Labs  02/23/15 0704  TROPIPOC 0.00   No results found for: CKTOTAL, CKMB, CKMBINDEX, TROPONINI  BNP: No results for input(s): PROBNP in the last 72 hours.  D-Dimer: No results for input(s): DDIMER in the last 72 hours.  CBG: No results for input(s): GLUCAP in the last 72 hours.  Hemoglobin A1C: No results for input(s): HGBA1C in the last 72 hours.  Lipid Panel:  Recent Labs  02/23/15 0658  CHOL 124  HDL 34*  LDLCALC 37  TRIG 265*  CHOLHDL 3.6    Thyroid Function Tests: No results for input(s): TSH, T4TOTAL, FREET4, T3FREE, THYROIDAB in the last 72 hours.  Anemia Panel: No results for input(s): VITAMINB12, FOLATE, FERRITIN, TIBC, IRON, RETICCTPCT in the last 72 hours.  Coagulation: No results for input(s): LABPROT, INR in the last 72 hours.  Urine Drug Screen: Drugs of Abuse:     Component Value Date/Time   LABOPIA POSITIVE* 02/23/2015 1001   COCAINSCRNUR NONE DETECTED 02/23/2015 1001   LABBENZ NONE DETECTED 02/23/2015 1001   AMPHETMU NONE DETECTED 02/23/2015 1001   THCU NONE DETECTED 02/23/2015 1001   LABBARB NONE DETECTED 02/23/2015 1001    Alcohol Level: No results for input(s): ETH in the last 72 hours.  Urinalysis: No results found for: COLORURINE, APPEARANCEUR, LABSPEC, PHURINE, GLUCOSEU, HGBUR, BILIRUBINUR, KETONESUR, PROTEINUR, UROBILINOGEN, NITRITE, LEUKOCYTESUR  Imaging results:  Dg Chest 2 View  02/23/2015  CLINICAL DATA:  Shortness of breath  and chest pressure for 1 day EXAM: CHEST  2 VIEW COMPARISON:  None. FINDINGS: There is no edema or consolidation. The heart size and pulmonary vascularity are normal. No adenopathy. No bone lesions. IMPRESSION: No edema or consolidation. Electronically Signed   By: Lowella Grip III M.D.   On: 02/23/2015 07:56    EKG: EKG Interpretation  Date/Time:  Wednesday February 23 2015 06:50:53 EDT Ventricular Rate:  71 PR Interval:  174 QRS Duration: 88 QT Interval:  382 QTC Calculation: 415 R Axis:   77 Text Interpretation:  Sinus rhythm Baseline wander in lead(s) III Confirmed by Alvino Chapel  MD, Ovid Curd [phone]) on 02/23/2015 7:11:33 AM   Antibiotics: Antibiotics Given (last 72 hours)    None      Anti-infectives    None     Consults:    Assessment & Plan by Problem: Principal Problem:   Chest pain Active Problems:   Hepatitis C   Schizoaffective disorder, bipolar type (H[facility])  Atypical Chest Pain  Pt chest pain does not sound cardiac in origin given no symptoms of exertional CP and occuring while lying down.  Also had previous stress test in 2010 while in [place] that was normal according to pt.  EKG and troponin reassuring.  Likely MSK given tenderness to palpation.  Other possible etiologies include GERD and anxiety possibly contributing.  Risk factors for ACS include smoking and previous history of  "small heart attack."  TIMI score is 0.   -admit to observation (telemetry) -troponins x 3 -O2 PRN  -lipid panel, HA1c -EKG in AM and prn chest pain -UDS -smoking cessation counseling -GI cocktail   Hepatitis C  Viral load 934,000 on 12/09/17.  HIV neg.  -needs referral for tx   Schizoaffective disorder, bipolar type -cont current meds   Tobacco abuse Pt has tobacco use. -RN to provide smoking cessation   FEN  Fluids-None  Electrolytes-Replete as needed.  Nutrition- Heart healthy  VTE  prophylaxis  lovenox 40mg  SQ qd  Disposition Disposition deferred at this time,  awaiting improvement of current medical problems. Anticipated discharge tomorrow.       Emergency Contact Contact Information    Name Relation Home Work [place] Daughter [phone]        The patient does not have a current PCP (No primary care provider on file.) and does need an [facility] hospital follow-up appointment after discharge.  Signed Jones Bales, MD PGY-3, Internal Medicine Teaching Service 02/23/2015, 11:32 AM    Date: 02/23/2015  Patient name: Hilda Wexler  Medical record number: [id number]  Date of birth: [birth year]   I have seen and evaluated Lu Duffel and discussed their care with the Residency Team.   Assessment and Plan: I have seen and evaluated the patient as outlined above. I agree with the formulated Assessment and Plan as detailed in the residents' admission note, with the following changes:   1. Chest Pain The etiology of this is unclear.  His risk factors for CAD are- smoking, prev cocaine use. His CXR is negative. His troponins are negative.  Would consider CT chest, CT angio.  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   3. Hepattis C He has positive DNA Check HIV Start of Hep A vax series Will need elastogram at f/u.  Would defer starting him on therapy til he is drug free.    4. LUE abscess Will f/u, consider po anbx as needed.   Campbell Riches, MD 11/2/20164:14 PM

## 2015-02-23 NOTE — ED Notes (Signed)
Pt brought via EMS with c/o chest pain. Per EMS, chest pain began last night about 2300. Pt describes pain as something sitting on his chest; no radiation. Pain continued through the night with no relief. EMS administered  324 ASA, 1 nitro, and 4mg  zofran en route. No pain relief from the nitro, pain continues at 10/10. Pt has hx HTN, noncompliant with meds.

## 2015-02-23 NOTE — Progress Notes (Signed)
Spoke with pt, inquired about abscess on LUE. Stated it was from IV drug use. Asked the last time he used drugs"I used heroin yesterday" (02/22/15). States that is the only drug he has used in the past week.

## 2015-02-23 NOTE — ED Provider Notes (Signed)
CSN: [id number]     Arrival date & time 02/23/15  8250 History   First MD Initiated Contact with Patient 02/23/15 [phone]     Chief Complaint  Patient presents with  . Chest Pain   Ronnie Burton is a 54 y.o. male with a history of hypertension, MI, schizophrenia and bipolar disorder who presents to the ED via EMS complaining of substernal and left-sided chest pain and pressure ongoing since 11 PM last night. He reports he's been having constant 10/10 substernal left-sided chest pressure and pain for the past 8 hours. He denies any radiating pain to his extremities. Reports feeling very anxious and earlier reports having numbness and tingling in his right hand and foot that has since resolved. He reports feeling short of breath and having wheezing. He denies coughing. He reports having palpitations earlier today but none currently. He denies back pain. He is unable to identify aggravating or alleviating factors of his chest pain. The patient received 324 mg aspirin and one nitroglycerin in route by EMS. Patient denies improvement of pain with nitroglycerin. The patient reports she had "small" MI at the age of 42 and denies stent placement.  He denies having current cardiologist. The patient denies fevers, chills, cough, hemoptysis, leg pain, leg swelling, abdominal pain, vomiting, syncope, lightheadedness or dizziness.   (Consider location/radiation/quality/duration/timing/severity/associated sxs/prior Treatment) HPI  Past Medical History  Diagnosis Date  . Hypertension   . Schizophrenia ([place])   . Bipolar 1 disorder ([place])    History reviewed. No pertinent past surgical history. Family History  Problem Relation Age of Onset  . Alcoholism Father    Social History  Substance Use Topics  . Smoking status: Current Every Day Smoker -- 0.25 packs/day    Types: Cigarettes  . Smokeless tobacco: Never Used  . Alcohol Use: No     Comment: occ    Review of Systems  Constitutional: Negative for  fever and chills.  HENT: Negative for congestion and sore throat.   Eyes: Negative for visual disturbance.  Respiratory: Positive for chest tightness, shortness of breath and wheezing. Negative for cough.   Cardiovascular: Positive for chest pain and palpitations. Negative for leg swelling.  Gastrointestinal: Positive for nausea. Negative for vomiting, abdominal pain and diarrhea.  Genitourinary: Negative for dysuria and difficulty urinating.  Musculoskeletal: Negative for back pain and neck pain.  Skin: Negative for rash.  Neurological: Negative for dizziness, syncope, weakness, light-headedness and headaches.      Allergies  Review of patient's allergies indicates no known allergies.  Home Medications   Prior to Admission medications   Medication Sig Start Date End Date Taking? Authorizing Provider  hydrOXYzine (ATARAX/VISTARIL) 50 MG tablet Take 1 tablet (50 mg total) by mouth 3 (three) times daily as needed for anxiety. 12/13/14  Yes Encarnacion Slates, NP  QUEtiapine (SEROQUEL) 50 MG tablet Take 1 tablet (50 mg) twice daily & 3 tablets (150 mg) at bedtime: For mood control 12/13/14  Yes Encarnacion Slates, NP  traZODone (DESYREL) 150 MG tablet Take 1 tablet (150 mg total) by mouth at bedtime. For sleep 12/13/14  Yes Encarnacion Slates, NP  cephALEXin (KEFLEX) 500 MG capsule Take 1 capsule (500 mg total) by mouth 4 (four) times daily. 02/16/15   Kayla Rose, PA-C   BP 109/58 mmHg  Pulse 59  Temp(Src) 98.1 F (36.7 C) (Oral)  Resp 18  SpO2 97% Physical Exam  Constitutional: He is oriented to person, place, and time. He appears well-developed and well-nourished. No distress.  Nontoxic appearing.  HENT:  Head: Normocephalic and atraumatic.  Mouth/Throat: Oropharynx is clear and moist.  Eyes: Conjunctivae are normal. Pupils are equal, round, and reactive to light. Right eye exhibits no discharge. Left eye exhibits no discharge.  Neck: Normal range of motion. Neck supple. No JVD present. No  tracheal deviation present.  Cardiovascular: Normal rate, regular rhythm, normal heart sounds and intact distal pulses.  Exam reveals no gallop and no friction rub.   No murmur heard. Bilateral radial, posterior tibialis and dorsalis pedis pulses are intact.    Pulmonary/Chest: Effort normal. No respiratory distress. He has no wheezes. He has no rales. He exhibits tenderness.  Lung sounds slightly diminished bilaterally. Patient has scattered wheezes. Patient has tenderness to palpation to his left anterior chest wall which reproduces his chest pain.  Abdominal: Soft. Bowel sounds are normal. He exhibits no distension. There is no tenderness. There is no guarding.  Musculoskeletal: He exhibits no edema or tenderness.  No lower extremity edema or tenderness. Patient is spontaneously moving all extremities in a coordinated fashion exhibiting good strength.   Lymphadenopathy:    He has no cervical adenopathy.  Neurological: He is alert and oriented to person, place, and time. Coordination normal.  Patient is alert and right 3. Sensation intact in his bilateral upper and lower extremities.  Skin: Skin is warm and dry. No rash noted. He is not diaphoretic. No erythema. No pallor.  Psychiatric: He has a normal mood and affect. His behavior is normal.  Nursing note and vitals reviewed.   ED Course  Procedures (including critical care time) Labs Review Labs Reviewed  BASIC METABOLIC PANEL - Abnormal; Notable for the following:    Sodium 133 (*)    Chloride 100 (*)    Glucose, Bld 108 (*)    All other components within normal limits  CBC - Abnormal; Notable for the following:    WBC 11.0 (*)    All other components within normal limits  URINE RAPID DRUG SCREEN, HOSP PERFORMED  I-STAT TROPOININ, ED    Imaging Review Dg Chest 2 View  02/23/2015  CLINICAL DATA:  Shortness of breath and chest pressure for 1 day EXAM: CHEST  2 VIEW COMPARISON:  None. FINDINGS: There is no edema or  consolidation. The heart size and pulmonary vascularity are normal. No adenopathy. No bone lesions. IMPRESSION: No edema or consolidation. Electronically Signed   By: Lowella Grip III M.D.   On: 02/23/2015 07:56   I have personally reviewed and evaluated these images and lab results as part of my medical decision-making.   EKG Interpretation   Date/Time:  Wednesday February 23 2015 06:50:53 EDT Ventricular Rate:  71 PR Interval:  174 QRS Duration: 88 QT Interval:  382 QTC Calculation: 415 R Axis:   77 Text Interpretation:  Sinus rhythm Baseline wander in lead(s) III  Confirmed by PICKERING  MD, NATHAN [phone]) on 02/23/2015 7:11:33 AM     Filed Vitals:   02/23/15 0700 02/23/15 0713 02/23/15 0715 02/23/15 0828  BP: 113/65 113/65 116/56 109/58  Pulse: 66 66 66 59  Temp:      TempSrc:      Resp: 14 22 11 18   SpO2: 97% 100% 99% 97%    MDM   Meds given in ED:  Medications  ipratropium-albuterol (DUONEB) 0.5-2.5 (3) MG/3ML nebulizer solution 3 mL (3 mLs Nebulization Given 02/23/15 0726)  morphine 2 MG/ML injection 2 mg (2 mg Intravenous Given 02/23/15 0815)  ondansetron (ZOFRAN) injection 4 mg (4  mg Intravenous Given 02/23/15 0814)    New Prescriptions   No medications on file    Final diagnoses:  Chest pain, unspecified chest pain type   This is a 54 y.o. male with a history of hypertension, MI, schizophrenia and bipolar disorder who presents to the ED via EMS complaining of substernal and left-sided chest pain and pressure ongoing since 11 PM last night. He reports he's been having constant 10/10 substernal left-sided chest pressure and pain for the past 8 hours. He denies any radiating pain to his extremities. Reports feeling very anxious and earlier reports having numbness and tingling in his right hand and foot that has since resolved. He reports feeling short of breath and having wheezing. He denies coughing. He reports having palpitations earlier today but none currently.  He denies back pain. He is unable to identify aggravating or alleviating factors of his chest pain. The patient received 324 mg aspirin and one nitroglycerin in route by EMS. Patient denies improvement of pain with nitroglycerin. The patient reports she had "small" MI at the age of 69 and denies stent placement.  He denies having current cardiologist. On exam patient is afebrile and nontoxic appearing. Lung sounds are slightly diminished bilaterally. No increased work of breathing. Oxygen saturation is 100% on room air. No tachypnea, tachycardia or hypoxia. Left chest wall is tender to palpation and reproduces his chest pain. Abdomen is soft and nontender to palpation. Patient received 324 mg of aspirin and nitroglycerin by EMS without relief. Patient reports subjective wheezing. Will provide with DuoNeb and reevaluate. Workup initiated for chest pain. EKG shows sinus rhythm with no previous EKGs to compare. Chest x-ray is unremarkable. CBC is remarkable only for a white count of 11,000. BMP is remarkable only for sodium of 133 and a chloride of 100. Troponin is 0.00.   Had reevaluation patient still complaining of chest pain. Will provide with morphine and Zofran as patient reports he still feeling slightly nauseated.  At reevaluation patient still complaining of chest pain. Patient has history of MI. Plan is for admission for ACS rule out. The patient is in agreement with admission. I consulted with Dr. Gordy Levan from internal medicine teaching service who accepted the patient for admission.  This patient was discussed with Dr. Alvino Chapel who agrees with assessment and plan.     Waynetta Pean, PA-C 02/23/15 [id number]  Davonna Belling, MD 02/23/15 [phone]

## 2015-02-23 NOTE — ED Notes (Signed)
Attempted report 

## 2015-02-23 NOTE — ED Notes (Signed)
Patient transported to X-ray 

## 2015-02-23 NOTE — ED Notes (Signed)
Admitting physician at bedside

## 2015-02-24 LAB — HEMOGLOBIN A1C
HEMOGLOBIN A1C: 5.8 % — AB (ref 4.8–5.6)
MEAN PLASMA GLUCOSE: 120 mg/dL

## 2015-02-24 MED ORDER — ALUMINUM-MAGNESIUM-SIMETHICONE 200-200-20 MG/5ML PO SUSP: 30.0000 mL | Freq: Three times a day (TID) | ORAL | Status: DC

## 2015-02-24 NOTE — Plan of Care (Signed)
Problem: Pain Managment: Goal: General experience of comfort will improve Outcome: Completed/Met Date Met:  02/24/15 Pt educated on pain assessment scoring and any pain medication that pt could have.

## 2015-02-24 NOTE — Plan of Care (Signed)
Problem: Safety: Goal: Ability to remain free from injury will improve Outcome: Completed/Met Date Met:  02/24/15 Educated pt on safety measures put into place. Placed call bell within reach and informed pt of phone number on the board.      

## 2015-02-24 NOTE — Discharge Instructions (Signed)
Please schedule a follow up visit with to the [facility] and [facility].  This information was given to you by the Care Manager prior to your discharge.  It is important that you make an appointment with them.  Please try to attend a Narcotics Anonymous meeting as well so that you can get clean.  You need to be treated for Hepatitis C but this cannot be done until you are clean.

## 2015-02-24 NOTE — Progress Notes (Signed)
Patient refusing morning EKG and vital signs. Will continue to monitor.

## 2015-02-24 NOTE — Progress Notes (Signed)
Subjective: No acute events overnight.  Patient states chest discomfort feels about the same and has not changed in character since admission.  States GI cocktail improves pain.    Objective: Vital signs in last 24 hours: Filed Vitals:   02/23/15 1611 02/23/15 2000 02/24/15 0000 02/24/15 0829  BP: 96/62 114/63 108/53 113/59  Pulse: 67 52 62 52  Temp: 98.1 F (36.7 C) 98.5 F (36.9 C) 98.4 F (36.9 C) 98.5 F (36.9 C)  TempSrc: Oral Oral Oral Oral  Resp: 16 16 18 18   Height:      Weight:      SpO2: 100% 100% 97% 99%   Weight change:   Intake/Output Summary (Last 24 hours) at 02/24/15 1056 Last data filed at 02/24/15 0100  Gross per 24 hour  Intake    360 ml  Output   1300 ml  Net   -940 ml   General: resting in bed, no acute distress HEENT: EOMI, no scleral icterus Cardiac: regular rhythm, bradycardic rate, no rubs, murmurs or gallops Pulm: clear to auscultation bilaterally, moving normal volumes of air Abd: soft, nontender, nondistended Ext: warm and well perfused, bandage covering LUE abscess Neuro: alert and oriented X3, cranial nerves II-XII grossly intact  Lab Results: Basic Metabolic Panel:  Recent Labs Lab 02/23/15 0658  NA 133*  K 3.8  CL 100*  CO2 24  GLUCOSE 108*  BUN 12  CREATININE 1.20  CALCIUM 9.1   Liver Function Tests:  Recent Labs Lab 02/23/15 1117  AST 42*  ALT 41  ALKPHOS 65  BILITOT 0.7  PROT 7.0  ALBUMIN 3.0*   No results for input(s): LIPASE, AMYLASE in the last 168 hours. No results for input(s): AMMONIA in the last 168 hours. CBC:  Recent Labs Lab 02/23/15 0658  WBC 11.0*  HGB 13.3  HCT 39.5  MCV 92.7  PLT 196   Cardiac Enzymes:  Recent Labs Lab 02/23/15 1117 02/23/15 1626  TROPONINI <0.03 <0.03   BNP: No results for input(s): PROBNP in the last 168 hours. D-Dimer: No results for input(s): DDIMER in the last 168 hours. CBG: No results for input(s): GLUCAP in the last 168 hours. Hemoglobin  A1C:  Recent Labs Lab 02/23/15 0658  HGBA1C 5.8*   Fasting Lipid Panel:  Recent Labs Lab 02/23/15 0658  CHOL 124  HDL 34*  LDLCALC 37  TRIG 265*  CHOLHDL 3.6   Thyroid Function Tests: No results for input(s): TSH, T4TOTAL, FREET4, T3FREE, THYROIDAB in the last 168 hours. Coagulation: No results for input(s): LABPROT, INR in the last 168 hours. Anemia Panel: No results for input(s): VITAMINB12, FOLATE, FERRITIN, TIBC, IRON, RETICCTPCT in the last 168 hours. Urine Drug Screen: Drugs of Abuse     Component Value Date/Time   LABOPIA POSITIVE* 02/23/2015 1001   COCAINSCRNUR NONE DETECTED 02/23/2015 1001   LABBENZ NONE DETECTED 02/23/2015 1001   AMPHETMU NONE DETECTED 02/23/2015 1001   THCU NONE DETECTED 02/23/2015 1001   LABBARB NONE DETECTED 02/23/2015 1001    Alcohol Level: No results for input(s): ETH in the last 168 hours. Urinalysis: No results for input(s): COLORURINE, LABSPEC, PHURINE, GLUCOSEU, HGBUR, BILIRUBINUR, KETONESUR, PROTEINUR, UROBILINOGEN, NITRITE, LEUKOCYTESUR in the last 168 hours.  Invalid input(s): APPERANCEUR Misc. Labs: none  Micro Results: No results found for this or any previous visit (from the past 240 hour(s)). Studies/Results: Dg Chest 2 View  02/23/2015  CLINICAL DATA:  Shortness of breath and chest pressure for 1 day EXAM: CHEST  2 VIEW COMPARISON:  None. FINDINGS: There is no edema or consolidation. The heart size and pulmonary vascularity are normal. No adenopathy. No bone lesions. IMPRESSION: No edema or consolidation. Electronically Signed   By: Lowella Grip III M.D.   On: 02/23/2015 07:56   Ct Angio Chest Pe W/cm &/or Wo Cm  02/23/2015  CLINICAL DATA:  Left-sided chest pain with shortness of breath this morning. EXAM: CT ANGIOGRAPHY CHEST WITH CONTRAST TECHNIQUE: Multidetector CT imaging of the chest was performed using the standard protocol during bolus administration of intravenous contrast. Multiplanar CT image reconstructions  and MIPs were obtained to evaluate the vascular anatomy. CONTRAST:  158mL OMNIPAQUE IOHEXOL 350 MG/ML SOLN COMPARISON:  None. FINDINGS: Technically adequate study with good opacification of the central and segmental pulmonary arteries. No focal filling defects are demonstrated. No evidence of significant pulmonary embolus. Mild cardiac enlargement. Normal caliber thoracic aorta. No aortic dissection. Great vessel origins are patent. No significant lymphadenopathy in the chest. Esophagus is decompressed. Mild dependent changes in the lung bases. No focal airspace disease or consolidation. No pleural effusions. No pneumothorax. Airways appear patent. Included portions of the upper abdominal organs are grossly unremarkable. No destructive bone lesions. Review of the MIP images confirms the above findings. IMPRESSION: No evidence of significant pulmonary embolus. No evidence of active pulmonary disease. Electronically Signed   By: Lucienne Capers M.D.   On: 02/23/2015 21:15   Medications: Scheduled Meds: . enoxaparin (LOVENOX) injection  40 mg Subcutaneous Q24H  . QUEtiapine  150 mg Oral QHS  . QUEtiapine  50 mg Oral BID  . traZODone  150 mg Oral QHS   Continuous Infusions:  PRN Meds:.acetaminophen, albuterol, gi cocktail, hydrOXYzine, morphine injection, ondansetron (ZOFRAN) IV Assessment/Plan: Principal Problem:   Chest pain Active Problems:   Hepatitis C   Schizoaffective disorder, bipolar type (HCC)   Cigarette smoker   Pain in the chest   Intravenous drug abuse  Atypical Chest Pain: likely MSK or GI related given reproducibility and relieve with GI cocktail -troponins negative x 3 -CXR negative -CTA chest negative for PE or septic emboli -risk factor work-up for CAD with Hgb A1c 5.8 and lipid panel with TC 124, LDL 37 -discharge with rx for GI cocktail -offered substance abuse counseling [ ]  follow up blood cultures x 2  Hepatitis C -patient aware of his Hep C status and aware that  needs to be clean prior to receiving treatment -viral load 934,000 on 12/10/14  Substance abuse -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  The patient does not have transportation limitations that hinder transportation to clinic appointments.  .Services Needed at time of discharge: Y = Yes, Blank = No PT:   OT:   RN:   Equipment:   Other:       Jule Ser, DO 02/24/2015, 10:56 AM    Date: 02/24/2015  Patient name: Ronnie Burton  Medical record number: [id number]  Date of birth: [birth year]   This patient's plan of care was discussed with the house staff. Please see their note for complete details. I concur with their findings.  Will work on d/c plan, have SW meet with pt regarding drug rehab services.  Pain better with GI cocktail per pt.   Campbell Riches, MD 02/24/2015, 11:27 AM

## 2015-02-24 NOTE — Care Management Note (Signed)
Case Management Note  Patient Details  Name: Mayfield Schoene MRN: [id number] Date of Birth: 04/21/1961  Subjective/Objective: Pt admitted for chest pain. Pt is without insurance and PCP. CM did touch base with MD to see if the clinic will follow pt post d/c.                    Action/Plan: CM did provide pt with the information to the [facility]. No appointment made at this time. Pt will have ot take responsibility to schedule appointment. No further needs from CM at this time.    Expected Discharge Date:                  Expected Discharge Plan:  Home/Self Care  In-House Referral:  NA  Discharge planning Services  CM Consult, [facility]  Post Acute Care Choice:  NA Choice offered to:  NA  DME Arranged:  N/A DME Agency:  NA  HH Arranged:  NA HH Agency:  NA  Status of Service:  Completed, signed off  Medicare Important Message Given:    Date Medicare IM Given:    Medicare IM give by:    Date Additional Medicare IM Given:    Additional Medicare Important Message give by:     If discussed at [place] of Stay Meetings, dates discussed:    Additional Comments:  Bethena Roys, RN 02/24/2015, 11:09 AM

## 2015-02-24 NOTE — Discharge Summary (Signed)
Name: Ronnie Burton MRN: [id number] DOB: December 06, 1960 54 y.o. PCP: No primary care provider on file.  Date of Admission: 02/23/2015  6:49 AM Date of Discharge: 02/24/2015 Attending Physician: Campbell Riches, MD  Discharge Diagnosis: Principal Problem:   Chest pain Active Problems:   Hepatitis C   Schizoaffective disorder, bipolar type ([place])   Cigarette smoker   Pain in the chest   Intravenous drug abuse  Discharge Medications:   Medication List    STOP taking these medications        cephALEXin 500 MG capsule  Commonly known as:  KEFLEX      TAKE these medications        aluminum-magnesium hydroxide-simethicone [phone] MG/5ML Susp  Commonly known as:  MAALOX  Take 30 mLs by mouth 4 (four) times daily -  before meals and at bedtime.     hydrOXYzine 50 MG tablet  Commonly known as:  ATARAX/VISTARIL  Take 1 tablet (50 mg total) by mouth 3 (three) times daily as needed for anxiety.     QUEtiapine 50 MG tablet  Commonly known as:  SEROQUEL  Take 1 tablet (50 mg) twice daily & 3 tablets (150 mg) at bedtime: For mood control     traZODone 150 MG tablet  Commonly known as:  DESYREL  Take 1 tablet (150 mg total) by mouth at bedtime. For sleep        Disposition and follow-up:   [name] was discharged from [facility] in Stable condition.  At the hospital follow up visit please address:  1.  Patients IV drug abuse and potential willingness to seek assistance with Narcotics Anonymous.  Patient also needs to be treated for his hepatitis C infection, however, this is unlikely to happen until he abstains for IVDU.  2.  Labs / imaging needed at time of follow-up: none  3.  Pending labs/ test needing follow-up: blood cultures  Follow-up Appointments:   Discharge Instructions:     Discharge Instructions    Diet - low sodium heart healthy    Complete by:  As directed      Increase activity slowly    Complete by:  As directed           Consultations:    Procedures Performed:  Dg Chest 2 View  02/23/2015  CLINICAL DATA:  Shortness of breath and chest pressure for 1 day EXAM: CHEST  2 VIEW COMPARISON:  None. FINDINGS: There is no edema or consolidation. The heart size and pulmonary vascularity are normal. No adenopathy. No bone lesions. IMPRESSION: No edema or consolidation. Electronically Signed   By: Lowella Grip III M.D.   On: 02/23/2015 07:56   Ct Angio Chest Pe W/cm &/or Wo Cm  02/23/2015  CLINICAL DATA:  Left-sided chest pain with shortness of breath this morning. EXAM: CT ANGIOGRAPHY CHEST WITH CONTRAST TECHNIQUE: Multidetector CT imaging of the chest was performed using the standard protocol during bolus administration of intravenous contrast. Multiplanar CT image reconstructions and MIPs were obtained to evaluate the vascular anatomy. CONTRAST:  116mL OMNIPAQUE IOHEXOL 350 MG/ML SOLN COMPARISON:  None. FINDINGS: Technically adequate study with good opacification of the central and segmental pulmonary arteries. No focal filling defects are demonstrated. No evidence of significant pulmonary embolus. Mild cardiac enlargement. Normal caliber thoracic aorta. No aortic dissection. Great vessel origins are patent. No significant lymphadenopathy in the chest. Esophagus is decompressed. Mild dependent changes in the lung bases. No focal airspace disease or consolidation. No pleural effusions.  No pneumothorax. Airways appear patent. Included portions of the upper abdominal organs are grossly unremarkable. No destructive bone lesions. Review of the MIP images confirms the above findings. IMPRESSION: No evidence of significant pulmonary embolus. No evidence of active pulmonary disease. Electronically Signed   By: Lucienne Capers M.D.   On: 02/23/2015 21:15    2D Echo: none  Cardiac Cath: none  Admission HPI: Ronnie Burton is a 54 y.o. male who has a past medical history of Hypertension; Schizophrenia ([place]); and Bipolar 1  disorder ([place]). He moved to [place] from [place] about 4 months ago to be with family (wife has family here).   Pt presents to the ED with dyspnea that began last night around 11:30PM while lying in bed. He went to bed and then woke up this AM with left sided non-radiating chest pain around 5:30AM. Reports the chest pain felt like something "sitting on his chest." Reports the pain was continuous for several hours. Endorses associated dyspnea, nausea, lightheadedness, diaphoresis. Denies ever experiencing chest pain with exertion. Reports having a chronic cough but denies hemoptysis or LE swelling. No h/o DVT/PE. He reports occasional reflex symptoms. Denies any recent heavy lifting or exercise. Is currently not working but worked in [employer] for 30 yrs. Reports having a "small heart attack" while in [place] at age 45 and states the pain feels similar. Also reports having a nuclear stress test in 2010 while living in [place] which was normal. Smokes ~6cig/day for the past 20+ yrs. 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 Denies personal history of HTN, dyslipidemia, DM. Denies FH of CAD. He has a h/o anxiety currently on trazadone, seroquel, and hydroxyzine.   Prior to arrival to the ED, pt was given 324mg  ASA and one NTG in route by EMS. Initial troponin neg and EKG without acute changes from prior.   Of note, he had a admission to behavioral health in August 2016 for suicidal ideations and auditory hallucinations to kill himself. Apparently had an admission while in [place] for cutting his wrist.   Hospital Course by problem list: Principal Problem:   Chest pain Active Problems:   Hepatitis C   Schizoaffective disorder, bipolar type ([place])   Cigarette smoker   Pain in the chest   Intravenous drug abuse   1. Atypical chest pain: this was likely MSK or GI related given the reproducibility of his pain as well as patients reported relief with GI  cocktail.  Concerning causes for chest pain were ruled out with negative serial troponins. CXR was negative and patient reported no cough or sputum production and was afebrile.  Given his history of IVDU, consideration was given to septic emboli from endocarditits causing this pain.  However, CTA chest was able to rule this out in addition to ruling out pulmonary embolus.  Risk factors for CAD were reassuring with an A1C of 5.8 and a lipid panel with a total cholesterol of 124 and LDL of 37.  Patient was discharged with a prescription for Maalox for relief of his symptoms.  2.  Hepatitis C: patient is aware of his current hepatitis C status.  Viral load was 934,000 in August 2016.  Will need to be treated but needs to be clean from drugs in order for this to happen.  3. Substance abuse: Patient continues to use heroin and declined offer for social work to discuss counseling options with him.  Negative HIV from August 2016.  Blood cultures obtained during admission have  been negative and will need follow up at discharge.  Discharge Vitals:   BP 113/59 mmHg  Pulse 52  Temp(Src) 98.5 F (36.9 C) (Oral)  Resp 18  Ht 6\' 5"  (1.956 m)  Wt 195 lb 11.2 oz (88.769 kg)  BMI 23.20 kg/m2  SpO2 99%  Discharge Labs:  Results for orders placed or performed during the hospital encounter of 02/23/15 (from the past 24 hour(s))  Troponin I-serum (0, 3, 6 hours)     Status: None   Collection Time: 02/23/15  4:26 PM  Result Value Ref Range   Troponin I <0.03 <0.031 ng/mL    Signed: Jule Ser, DO 02/24/2015, 11:21 AM    Services Ordered on Discharge: none Equipment Ordered on Discharge: none

## 2015-02-24 NOTE — Plan of Care (Signed)
Problem: Physical Regulation: Goal: Will remain free from infection Outcome: Completed/Met Date Met:  02/24/15 Pt educated on infection control.

## 2015-02-24 NOTE — Plan of Care (Signed)
Problem: Education: Goal: Knowledge of [facility] Education information/materials will improve Outcome: Completed/Met Date Met:  02/24/15 Pt admitted for chest pain. Discussed plan of care with pt. Plan of care info given to pt.

## 2015-02-24 NOTE — Plan of Care (Signed)
Problem: Health Behavior/Discharge Planning: Goal: Ability to manage health-related needs will improve Outcome: Completed/Met Date Met:  02/24/15 Educated pt on treatment plan and the need to comply with medication regimen.

## 2015-02-24 NOTE — Progress Notes (Signed)
Discharge paperwork was given to pt. Pt given [facility] for [facility] for [facility]/u appt for PCP. Pt educated on medication and discharge instructions. Pt given 2 bus passes to get home. Pt walked out with family member.   Ruben Reason, RN

## 2015-02-24 NOTE — Plan of Care (Signed)
Problem: Tissue Perfusion: Goal: Risk factors for ineffective tissue perfusion will decrease Outcome: Completed/Met Date Met:  02/24/15 Pt educated on VTE precautions and medication associated with VTE.

## 2015-02-28 LAB — CULTURE, BLOOD (ROUTINE X 2)
CULTURE: NO GROWTH
Culture: NO GROWTH

## 2016-08-02 ENCOUNTER — Encounter (HOSPITAL_COMMUNITY): Payer: Self-pay

## 2016-08-02 ENCOUNTER — Emergency Department (HOSPITAL_COMMUNITY)
Admission: EM | Admit: 2016-08-02 | Discharge: 2016-08-02 | Disposition: A | Discharge status: Home / Self Care | Payer: Self-pay | Attending: Emergency Medicine | Admitting: Emergency Medicine

## 2016-08-02 ENCOUNTER — Emergency Department (HOSPITAL_COMMUNITY): Payer: Self-pay

## 2016-08-02 DIAGNOSIS — Y9389 Activity, other specified: Secondary | ICD-10-CM | POA: Insufficient documentation

## 2016-08-02 DIAGNOSIS — W228XXA Striking against or struck by other objects, initial encounter: Secondary | ICD-10-CM | POA: Insufficient documentation

## 2016-08-02 DIAGNOSIS — Y999 Unspecified external cause status: Secondary | ICD-10-CM | POA: Insufficient documentation

## 2016-08-02 DIAGNOSIS — S99921A Unspecified injury of right foot, initial encounter: Secondary | ICD-10-CM | POA: Insufficient documentation

## 2016-08-02 DIAGNOSIS — Y929 Unspecified place or not applicable: Secondary | ICD-10-CM | POA: Insufficient documentation

## 2016-08-02 DIAGNOSIS — F17210 Nicotine dependence, cigarettes, uncomplicated: Secondary | ICD-10-CM | POA: Insufficient documentation

## 2016-08-02 DIAGNOSIS — I10 Essential (primary) hypertension: Secondary | ICD-10-CM | POA: Insufficient documentation

## 2016-08-02 MED ORDER — IBUPROFEN 600 MG PO TABS: 600.0000 mg | ORAL_TABLET | Freq: Four times a day (QID) | ORAL | 0 refills | Status: DC | PRN

## 2016-08-02 NOTE — ED Provider Notes (Signed)
[facility] DEPT Provider Note   CSN: [id number] Arrival date & time: 08/02/16  1950   By signing my name below, I, Hilbert Odor, attest that this documentation has been prepared under the direction and in the presence of Domenic Moras, PA-C. Electronically Signed: Hilbert Odor, Scribe. 08/02/16. 10:51 AM. History   Chief Complaint Chief Complaint  Patient presents with  . Foot Pain    The history is provided by the patient. No language interpreter was used.  HPI Comments: Ronnie Burton is a 56 y.o. male who presents to the Emergency Department complaining of right foot pain for the past several months. He reports some numbness and tingling to the ball of his foot since the onset, similar sensation to L foot as well but much less intense. The patient states that he recently hit his foot on the side of the bed this AM and his now having severe pain in his right foot. He rates this pain 10/10, affecting his little toe. He denies any known medical issues such as diabetes. He denies any ankle or knee pain.  Past Medical History:  Diagnosis Date  . Bipolar 1 disorder ([place])   . Headache   . Heart murmur   . Hypertension   . Schizophrenia [facility])     Patient Active Problem List   Diagnosis Date Noted  . Chest pain 02/23/2015  . Cigarette smoker 02/23/2015  . Pain in the chest   . Intravenous drug abuse   . Opioid use disorder, severe, dependence ([place]) 12/07/2014  . Hepatitis C 12/07/2014  . Schizoaffective disorder, bipolar type ([place]) 12/07/2014    History reviewed. No pertinent surgical history.     Home Medications    Prior to Admission medications   Medication Sig Start Date End Date Taking? Authorizing Provider  aluminum-magnesium hydroxide-simethicone (MAALOX) [phone] MG/5ML SUSP Take 30 mLs by mouth 4 (four) times daily -  before meals and at bedtime. 02/24/15   Jule Ser, DO  hydrOXYzine (ATARAX/VISTARIL) 50 MG tablet Take 1 tablet (50 mg total) by  mouth 3 (three) times daily as needed for anxiety. 12/13/14   Encarnacion Slates, NP  ibuprofen (ADVIL,MOTRIN) 600 MG tablet Take 1 tablet (600 mg total) by mouth every 6 (six) hours as needed. 08/02/16   Domenic Moras, PA-C  QUEtiapine (SEROQUEL) 50 MG tablet Take 1 tablet (50 mg) twice daily & 3 tablets (150 mg) at bedtime: For mood control 12/13/14   Encarnacion Slates, NP  traZODone (DESYREL) 150 MG tablet Take 1 tablet (150 mg total) by mouth at bedtime. For sleep 12/13/14   Encarnacion Slates, NP    Family History Family History  Problem Relation Age of Onset  . Alcoholism Father     Social History Social History  Substance Use Topics  . Smoking status: Current Every Day Smoker    Packs/day: 0.25    Years: 20.00    Types: Cigarettes  . Smokeless tobacco: Never Used  . Alcohol use No     Comment: occ     Allergies   Patient has no known allergies.   Review of Systems Review of Systems  Genitourinary: Negative for frequency.  Musculoskeletal: Positive for arthralgias. Negative for back pain.       Right foot.  Neurological: Positive for numbness.     Physical Exam Updated Vital Signs BP 117/81 (BP Location: Left Arm)   Pulse 69   Temp 97.9 F (36.6 C) (Oral)   Resp 18   Ht 6\' 5"  (1.956  m)   Wt 210 lb (95.3 kg)   SpO2 100%   BMI 24.90 kg/m   Physical Exam  Constitutional: He is oriented to person, place, and time. He appears well-developed and well-nourished.  HENT:  Head: Normocephalic.  Eyes: EOM are normal.  Neck: Normal range of motion.  Pulmonary/Chest: Effort normal.  Abdominal: He exhibits no distension.  Musculoskeletal: Normal range of motion.  Right foot: Mild pain throughout all toes. Significant pain to 5th toe with palpation without obvious sign of injuries. Good capillary refill. DP pulse palpable. Right ankle with full ROM.  Neurological: He is alert and oriented to person, place, and time.  Psychiatric: He has a normal mood and affect.  Nursing note and  vitals reviewed.    ED Treatments / Results  DIAGNOSTIC STUDIES: Oxygen Saturation is 100% on RA, normal by my interpretation.    COORDINATION OF CARE: 10:25 AM Discussed treatment plan with pt at bedside and pt agreed to plan. I will check the patient's Right foot X-ray.  Labs (all labs ordered are listed, but only abnormal results are displayed) Labs Reviewed - No data to display  EKG  EKG Interpretation None       Radiology Dg Foot Complete Right  Result Date: 08/02/2016 CLINICAL DATA:  56 year old male with a history of right fifth toe pain. EXAM: RIGHT FOOT COMPLETE - 3+ VIEW COMPARISON:  None. FINDINGS: There is no evidence of fracture or dislocation. Degenerative changes of the interphalangeal joints. Soft tissues are unremarkable. No radiopaque foreign body. IMPRESSION: No displaced fracture. Degenerative changes of the interphalangeal joints. Electronically Signed   By: Corrie Mckusick D.O.   On: 08/02/2016 09:55    Procedures Procedures (including critical care time)  Medications Ordered in ED Medications - No data to display   Initial Impression / Assessment and Plan / ED Course  I have reviewed the triage vital signs and the nursing notes.  Pertinent labs & imaging results that were available during my care of the patient were reviewed by me and considered in my medical decision making (see chart for details).     BP 119/69 (BP Location: Right Arm)   Pulse 61   Temp 97.8 F (36.6 C) (Oral)   Resp 16   Ht 6\' 5"  (1.956 m)   Wt 95.3 kg   SpO2 100%   BMI 24.90 kg/m    Final Clinical Impressions(s) / ED Diagnoses   Final diagnoses:  Right foot injury, initial encounter    New Prescriptions New Prescriptions   IBUPROFEN (ADVIL,MOTRIN) 600 MG TABLET    Take 1 tablet (600 mg total) by mouth every 6 (six) hours as needed.   I personally performed the services described in this documentation, which was scribed in my presence. The recorded information  has been reviewed and is accurate.   R foot injury, xray negative, no evidence concerning for infection.  NVI.  Doubt gout.  Hx of IVDU and opiate abuse.  NSAIDs given for pain.  RICE therapy discussed.   Domenic Moras, PA-C 08/02/16 [place], MD 08/02/16 [phone]

## 2016-08-02 NOTE — ED Triage Notes (Signed)
Pt states he hit his foot on the edge of the bed and now has pain in his right fifth toe. Pt denies hx of diabetes. Pt ambulatory.

## 2016-09-30 IMAGING — CR DG WRIST COMPLETE 3+V*R*
4 series · 4 of 4 positions shown · non-contrast
Comparison: None.

CLINICAL DATA: Status post fall today. Right wrist pain. Initial
encounter.

EXAM:
RIGHT WRIST - COMPLETE 3+ VIEW

[x wrist pa right]
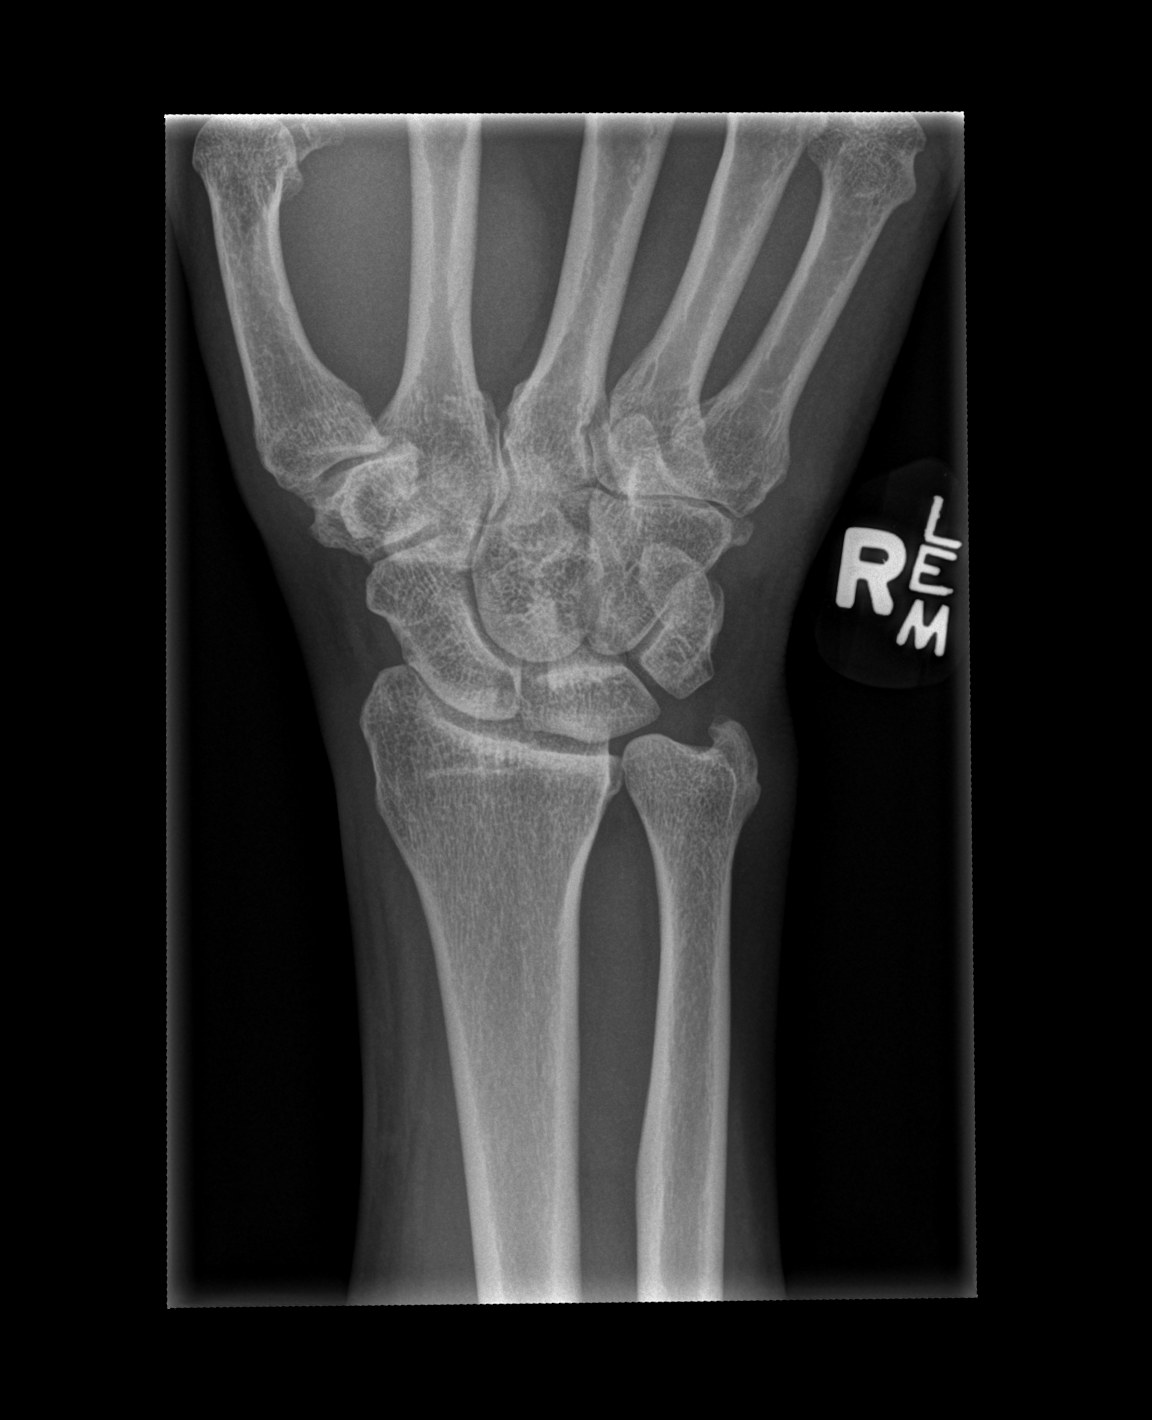

[x wrist obl right]
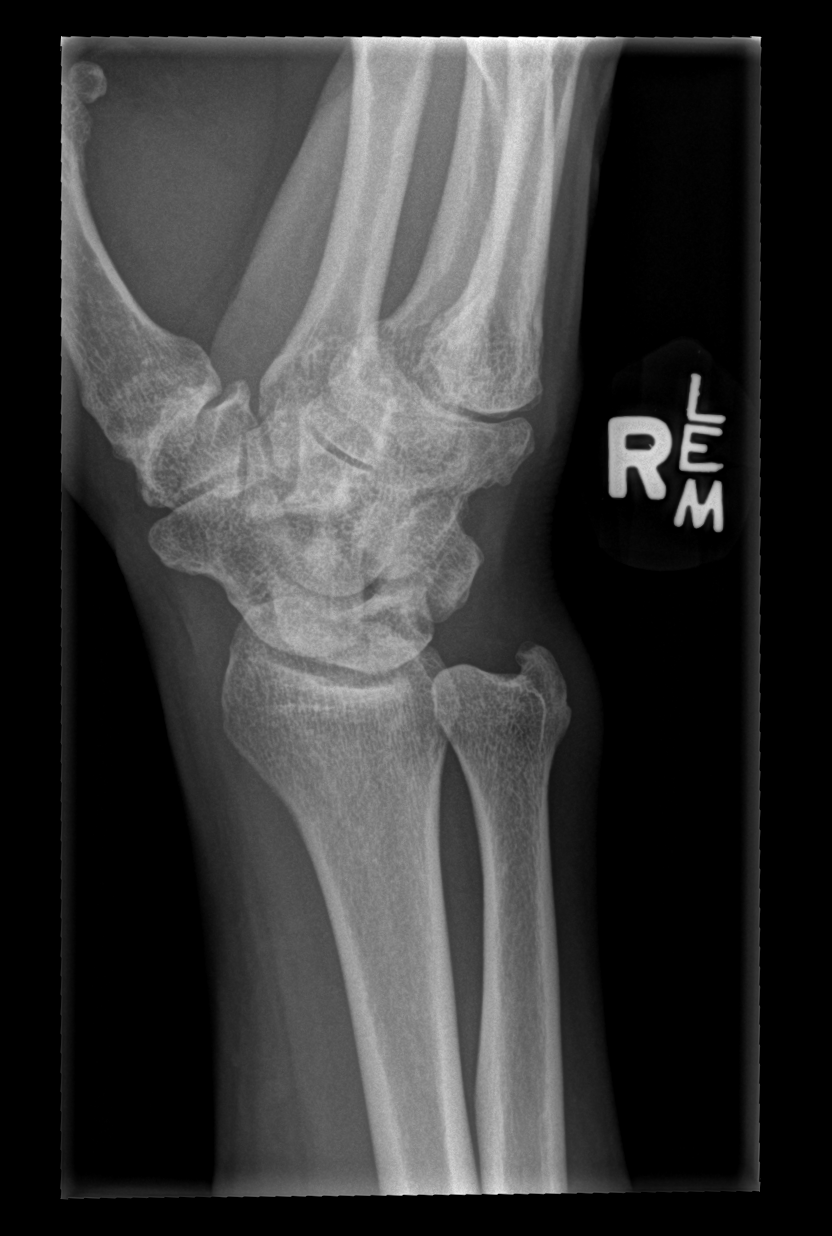

[x wrist lat right]
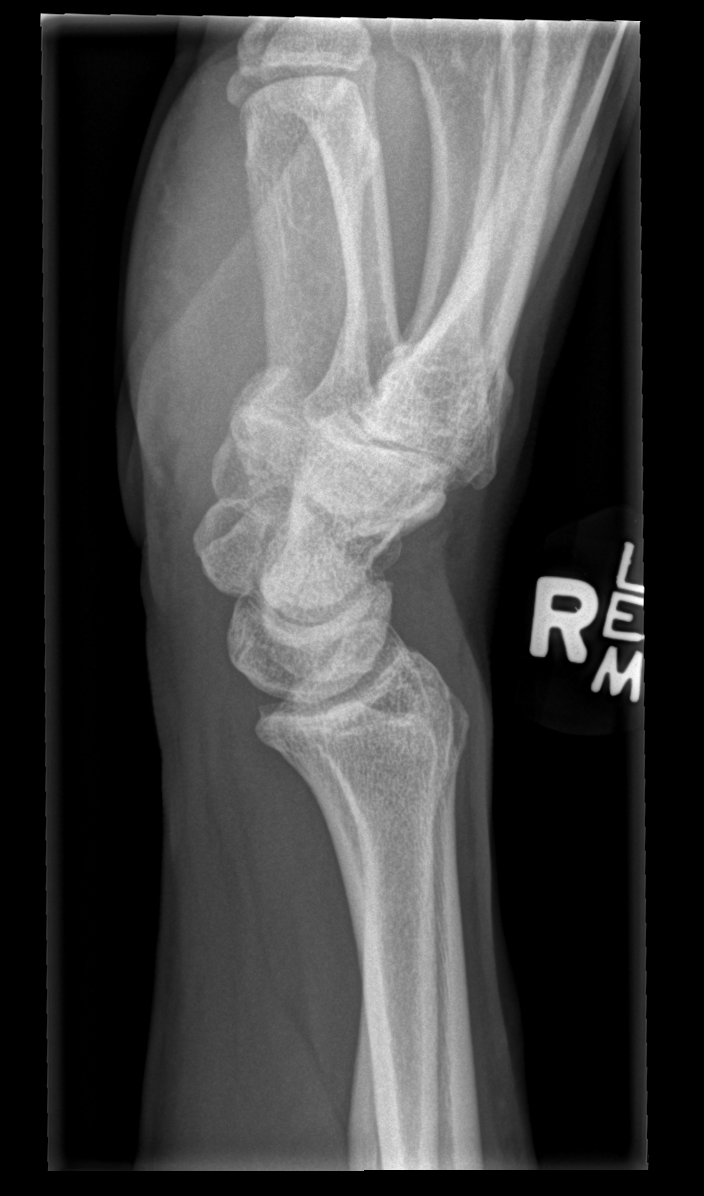

[x wrist navicular view right]
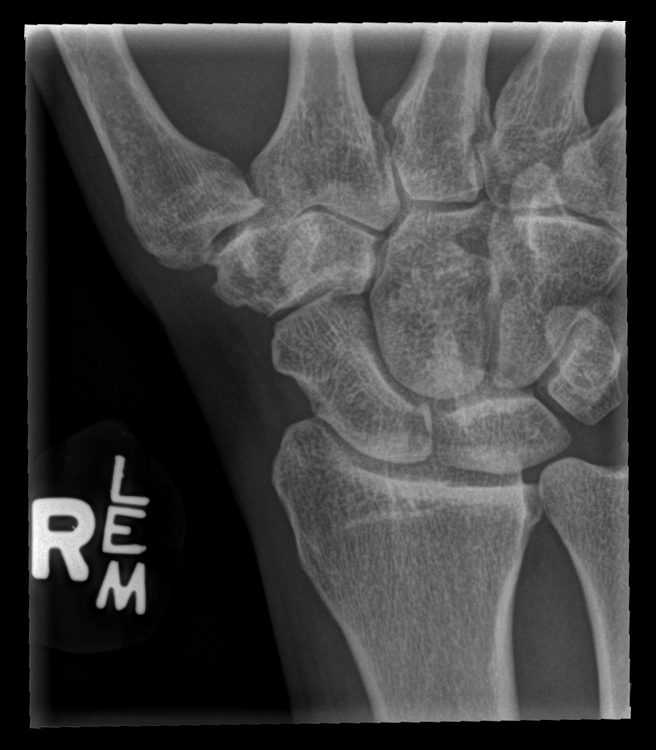

[4 of 4 positions shown; findings below may reference images not displayed]

FINDINGS: No acute bony or joint abnormality is identified. Mild appearing
degenerative change is seen at the first CMC joint. Soft tissues are
unremarkable.
IMPRESSION: No acute abnormality.

Mild appearing first CMC degenerative disease.

## 2016-11-17 ENCOUNTER — Encounter (HOSPITAL_COMMUNITY): Payer: Self-pay | Admitting: Emergency Medicine

## 2016-11-17 ENCOUNTER — Emergency Department (HOSPITAL_COMMUNITY)
Admission: EM | Admit: 2016-11-17 | Discharge: 2016-11-17 | Disposition: A | Discharge status: Home / Self Care | Payer: Medicaid Other | Attending: Emergency Medicine | Admitting: Emergency Medicine

## 2016-11-17 DIAGNOSIS — I10 Essential (primary) hypertension: Secondary | ICD-10-CM | POA: Insufficient documentation

## 2016-11-17 DIAGNOSIS — Z79899 Other long term (current) drug therapy: Secondary | ICD-10-CM | POA: Insufficient documentation

## 2016-11-17 DIAGNOSIS — R21 Rash and other nonspecific skin eruption: Secondary | ICD-10-CM | POA: Insufficient documentation

## 2016-11-17 DIAGNOSIS — F17210 Nicotine dependence, cigarettes, uncomplicated: Secondary | ICD-10-CM | POA: Insufficient documentation

## 2016-11-17 MED ORDER — TRIAMCINOLONE ACETONIDE 0.1 % EX CREA: 1.0000 "application " | TOPICAL_CREAM | Freq: Two times a day (BID) | CUTANEOUS | 0 refills | Status: DC

## 2016-11-17 MED ORDER — HYDROXYZINE HCL 25 MG PO TABS: 25.0000 mg | ORAL_TABLET | Freq: Four times a day (QID) | ORAL | 0 refills | Status: DC

## 2016-11-17 NOTE — ED Provider Notes (Signed)
[facility] DEPT Provider Note   CSN: [id number] Arrival date & time: 11/17/16  1121   By signing my name below, I, Eunice Blase, attest that this documentation has been prepared under the direction and in the presence of Janetta Hora, PA-C. Electronically signed, Eunice Blase, ED Scribe. 11/17/16. 12:11 PM.  History   Chief Complaint Chief Complaint  Patient presents with  . Rash   The history is provided by the patient and medical records. No language interpreter was used.    Ronnie Burton is a 56 y.o. male presenting to the Emergency Department concerning rashes to the forearms and R lower leg onset  2 weeks ago. Associated clear drainage. He describes small, itchy raised bumps to the forearms bilaterally and a patchy, scaly area of skin that is draining on the L shin. Pt states he used A&D ointment with minimal relief to affected areas on the L forearm. 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 No new hygenic or cosmetic products at home. No h/o similar symptoms noted. No pain. No fever, N/V, sore throat, voice change or throat swelling. No other complaints at this time. No PCP noted for F/U.  Past Medical History:  Diagnosis Date  . Bipolar 1 disorder ([place])   . Headache   . Heart murmur   . Hypertension   . Schizophrenia [facility])     Patient Active Problem List   Diagnosis Date Noted  . Chest pain 02/23/2015  . Cigarette smoker 02/23/2015  . Pain in the chest   . Intravenous drug abuse   . Opioid use disorder, severe, dependence ([place]) 12/07/2014  . Hepatitis C 12/07/2014  . Schizoaffective disorder, bipolar type ([place]) 12/07/2014    History reviewed. No pertinent surgical history.     Home Medications    Prior to Admission medications   Medication Sig Start Date End Date Taking? Authorizing Provider  aluminum-magnesium hydroxide-simethicone (MAALOX) [phone] MG/5ML SUSP Take 30 mLs by mouth 4 (four) times daily -  before meals and at bedtime. 02/24/15   Jule Ser,  DO  hydrOXYzine (ATARAX/VISTARIL) 50 MG tablet Take 1 tablet (50 mg total) by mouth 3 (three) times daily as needed for anxiety. 12/13/14   Lindell Spar I, NP  ibuprofen (ADVIL,MOTRIN) 600 MG tablet Take 1 tablet (600 mg total) by mouth every 6 (six) hours as needed. 08/02/16   Domenic Moras, PA-C  QUEtiapine (SEROQUEL) 50 MG tablet Take 1 tablet (50 mg) twice daily & 3 tablets (150 mg) at bedtime: For mood control 12/13/14   Lindell Spar I, NP  traZODone (DESYREL) 150 MG tablet Take 1 tablet (150 mg total) by mouth at bedtime. For sleep 12/13/14   Lindell Spar I, NP    Family History Family History  Problem Relation Age of Onset  . Alcoholism Father     Social History Social History  Substance Use Topics  . Smoking status: Current Every Day Smoker    Packs/day: 0.25    Years: 20.00    Types: Cigarettes  . Smokeless tobacco: Never Used  . Alcohol use Yes     Comment: occ     Allergies   Patient has no known allergies.   Review of Systems Review of Systems  Constitutional: Negative for chills, diaphoresis and fever.  HENT: Negative for sore throat and trouble swallowing.   Respiratory: Negative for shortness of breath.   Cardiovascular: Negative for chest pain.  Gastrointestinal: Negative for nausea and vomiting.  Skin: Positive for rash and wound. Negative for color change.  Allergic/Immunologic: Negative for immunocompromised state.  Hematological: Does not bruise/bleed easily.     Physical Exam Updated Vital Signs BP 127/79 (BP Location: Left Arm)   Pulse 64   Temp 98.6 F (37 C) (Oral)   Resp 18   SpO2 99%   Physical Exam  Constitutional: He is oriented to person, place, and time. He appears well-developed and well-nourished. No distress.  HENT:  Head: Normocephalic and atraumatic.  Eyes: Pupils are equal, round, and reactive to light. Conjunctivae and EOM are normal. Right eye exhibits no discharge. Left eye exhibits no discharge. No scleral icterus.  Neck:  Normal range of motion.  Cardiovascular: Normal rate.   Pulmonary/Chest: Effort normal. No respiratory distress.  Abdominal: He exhibits no distension.  Musculoskeletal: Normal range of motion.  Neurological: He is alert and oriented to person, place, and time.  Skin: Skin is warm and dry. Rash noted. Rash is papular.  Erythematous, papular rash with excoriations on the bilateral arms.   Lichenified, scaly plaque over exterior R shin with clear drainage.  Psychiatric: He has a normal mood and affect. His behavior is normal.  Nursing note and vitals reviewed.    ED Treatments / Results  DIAGNOSTIC STUDIES: Oxygen Saturation is 99% on RA, NL by my interpretation.    COORDINATION OF CARE: 12:07 PM-Discussed next steps with pt. Pt verbalized understanding and is agreeable with the plan. Will Rx medication.   Labs (all labs ordered are listed, but only abnormal results are displayed) Labs Reviewed - No data to display  EKG  EKG Interpretation None       Radiology No results found.  Procedures Procedures (including critical care time)  Medications Ordered in ED Medications - No data to display   Initial Impression / Assessment and Plan / ED Course  I have reviewed the triage vital signs and the nursing notes.  Pertinent labs & imaging results that were available during my care of the patient were reviewed by me and considered in my medical decision making (see chart for details).  56 year old male with non-specific rash. Vitals are normal. Will treat with Atarax and steroid cream. Referral to derm given if symptoms don't improve.   Final Clinical Impressions(s) / ED Diagnoses   Final diagnoses:  Rash and nonspecific skin eruption    New Prescriptions New Prescriptions   No medications on file   I personally performed the services described in this documentation, which was scribed in my presence. The recorded information has been reviewed and is accurate.      Recardo Evangelist, PA-C 11/17/16 1736    Pattricia Boss, MD 11/18/16 [phone]

## 2016-11-17 NOTE — Discharge Instructions (Signed)
Use Triamcinolone cream twice a day Take Hydroxyzine for itching as needed Call dermatology if symptoms are not improving

## 2016-11-17 NOTE — ED Triage Notes (Signed)
Scaly rash to R inner ankle and bilateral arms x 2 weeks. Pt reports itching and white drainage.

## 2016-11-17 NOTE — ED Notes (Signed)
ED Provider at bedside. 

## 2016-12-15 IMAGING — CR DG CHEST 2V
2 series · 2 of 2 positions shown · non-contrast
Comparison: None.

CLINICAL DATA: Shortness of breath and chest pressure for 1 day

EXAM:
CHEST  2 VIEW

[chest pa]
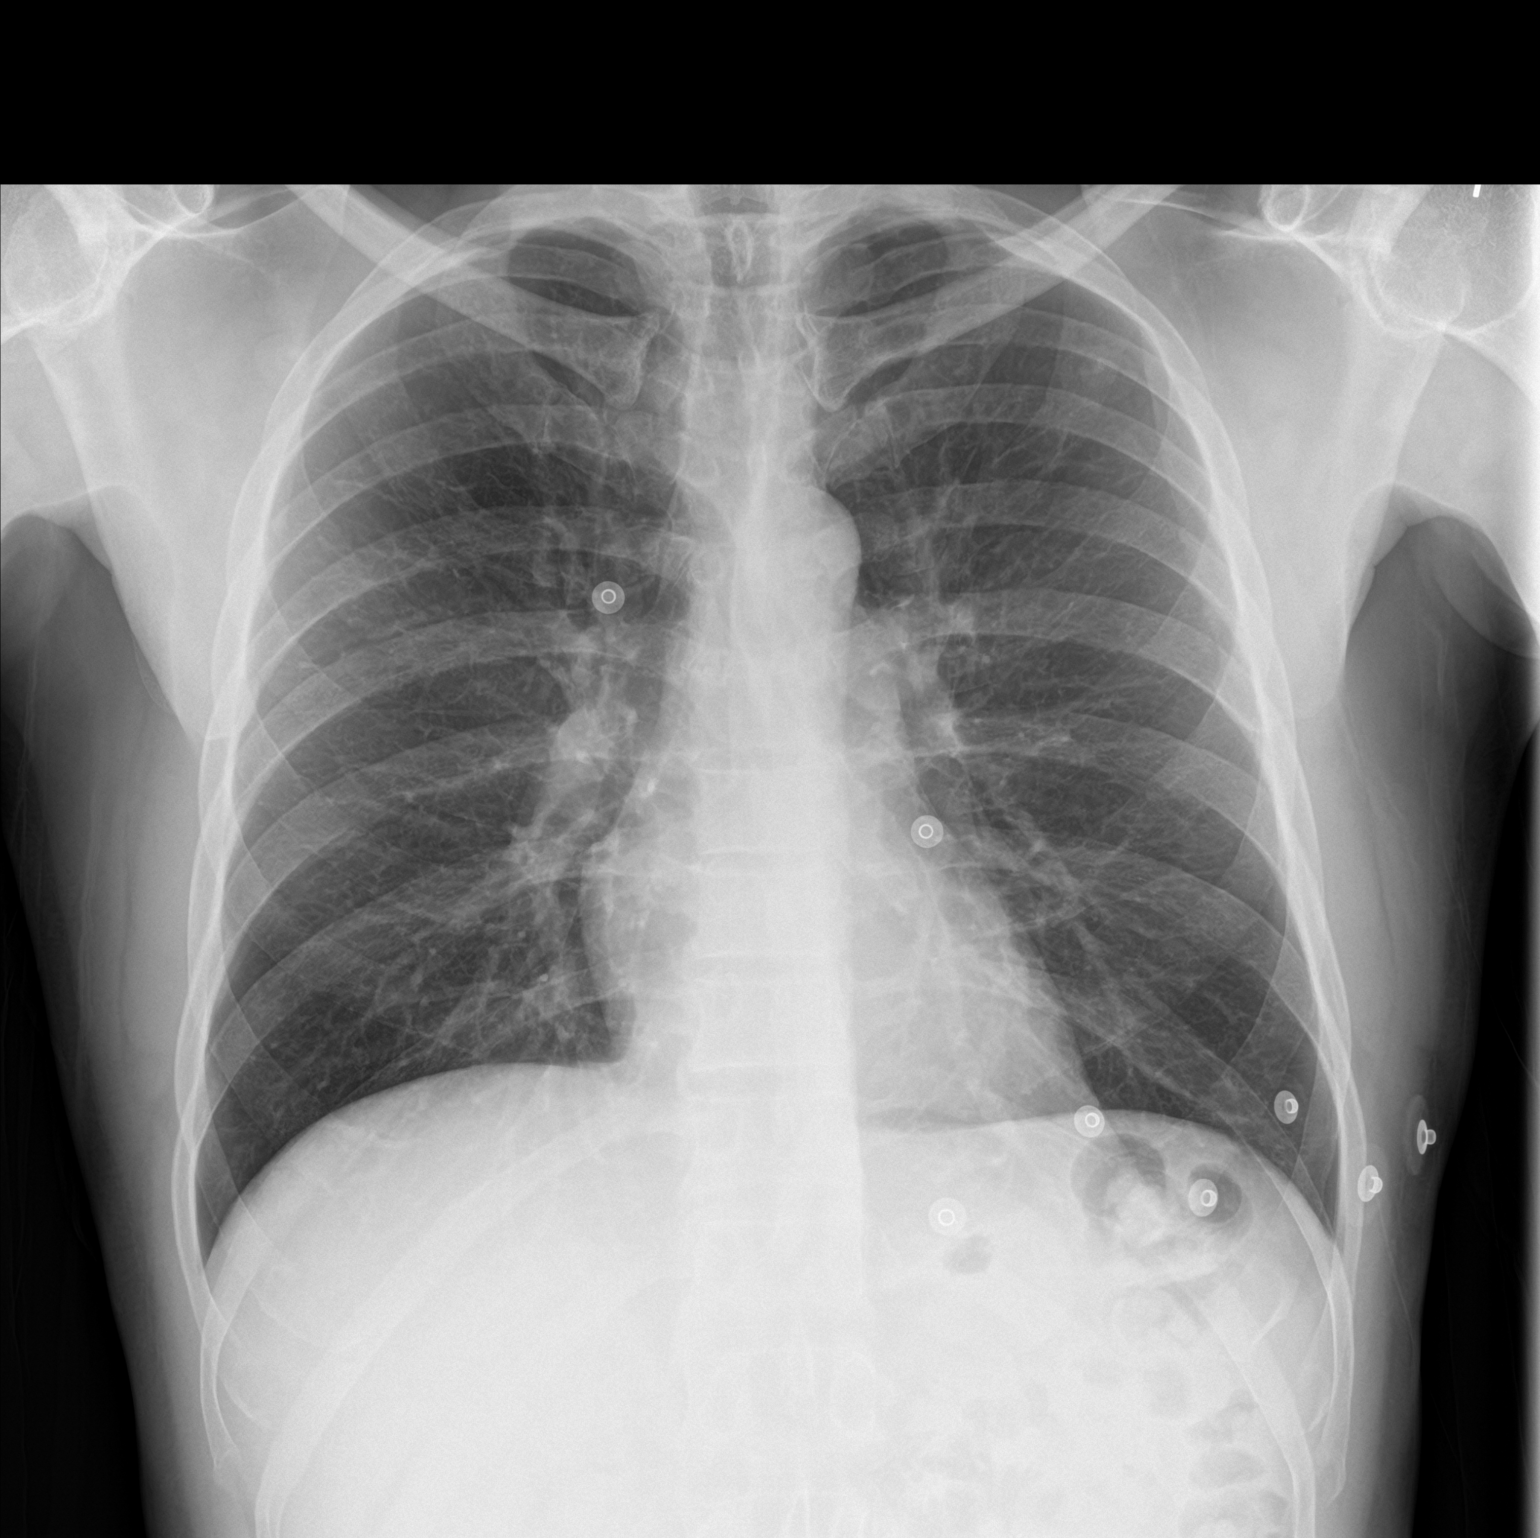

[chest lat]
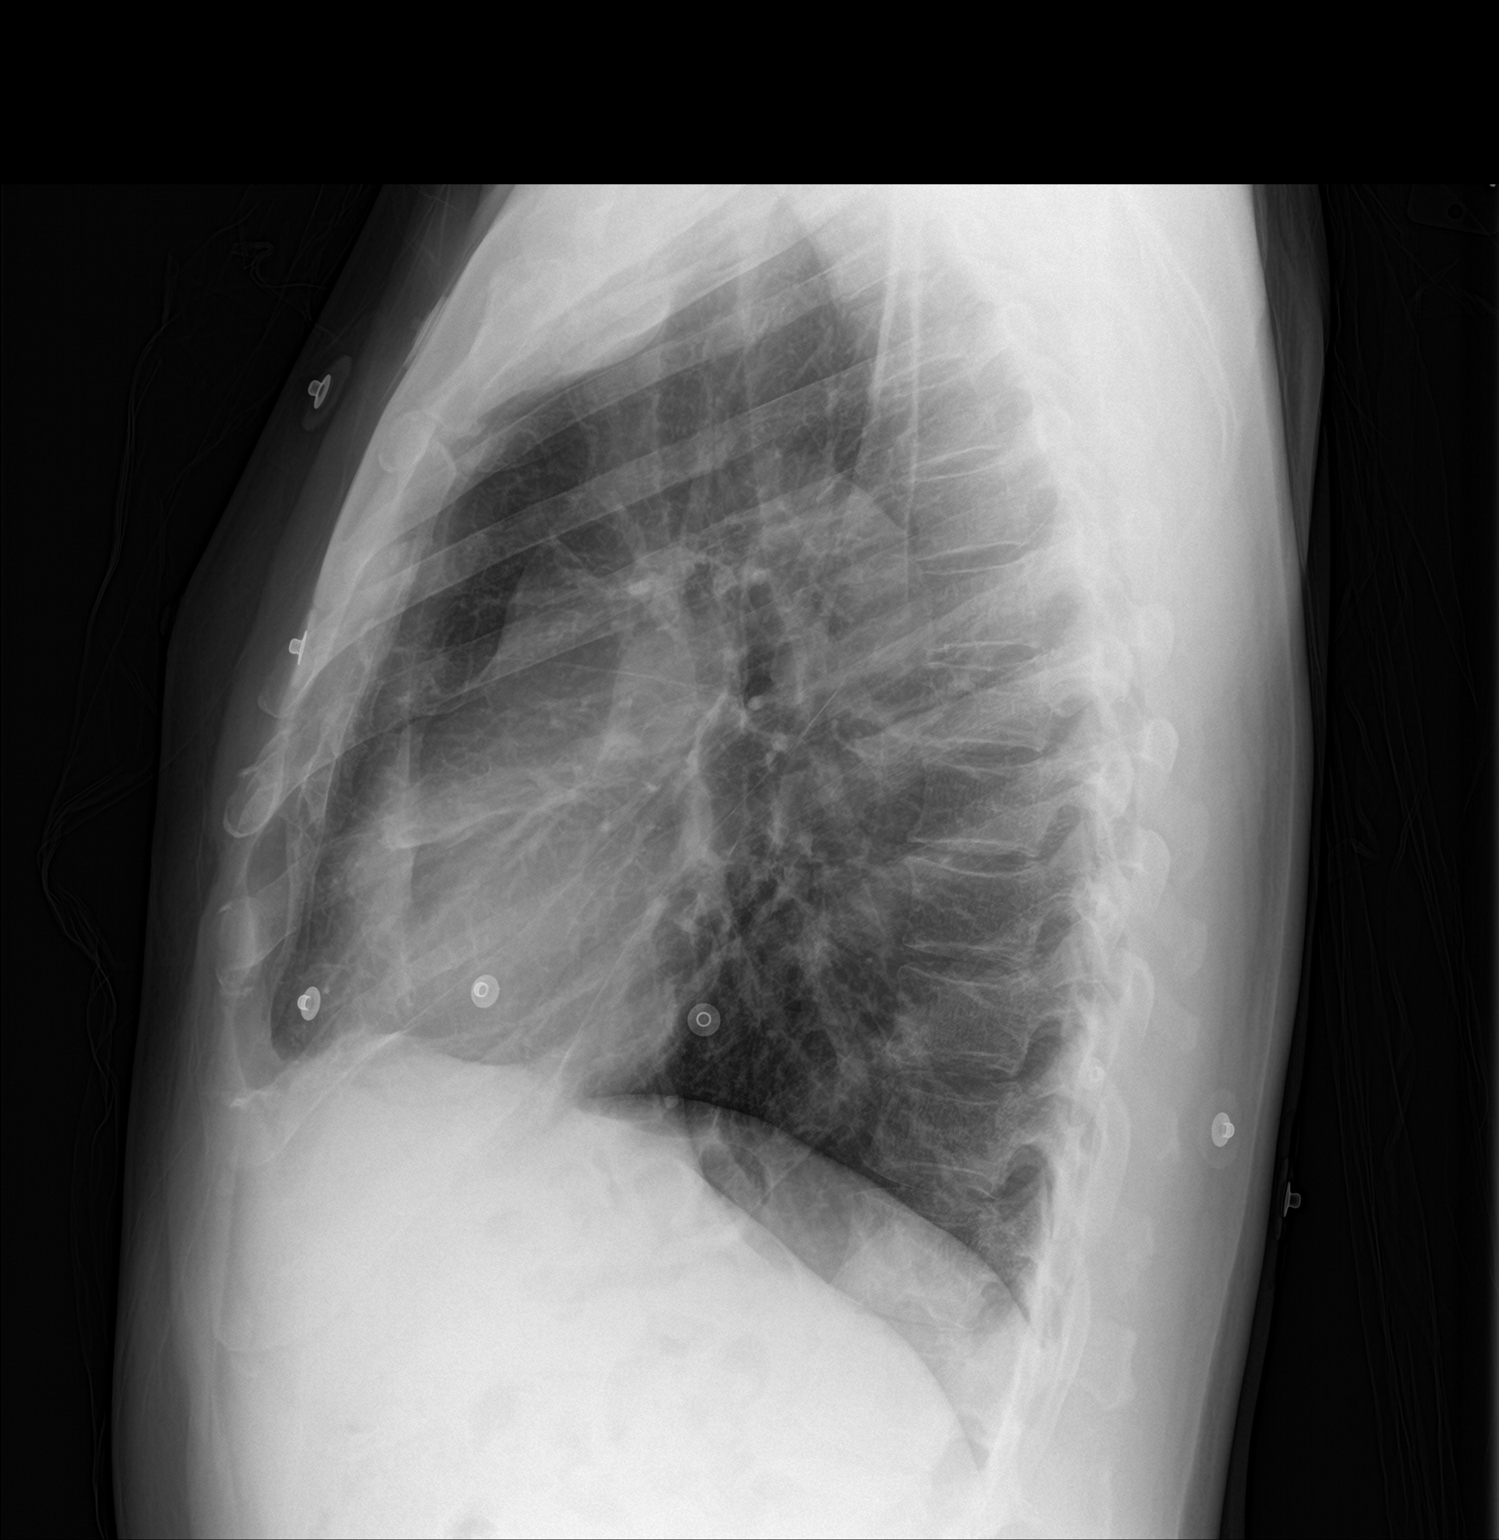

[2 of 2 positions shown; findings below may reference images not displayed]

FINDINGS: There is no edema or consolidation. The heart size and pulmonary
vascularity are normal. No adenopathy. No bone lesions.
IMPRESSION: No edema or consolidation.

## 2016-12-22 ENCOUNTER — Emergency Department (HOSPITAL_COMMUNITY)
Admission: EM | Admit: 2016-12-22 | Discharge: 2016-12-22 | Disposition: A | Discharge status: Home / Self Care | Payer: Self-pay | Attending: Emergency Medicine | Admitting: Emergency Medicine

## 2016-12-22 ENCOUNTER — Encounter (HOSPITAL_COMMUNITY): Payer: Self-pay | Admitting: *Deleted

## 2016-12-22 DIAGNOSIS — F17210 Nicotine dependence, cigarettes, uncomplicated: Secondary | ICD-10-CM | POA: Insufficient documentation

## 2016-12-22 DIAGNOSIS — L2082 Flexural eczema: Secondary | ICD-10-CM | POA: Insufficient documentation

## 2016-12-22 DIAGNOSIS — Z79899 Other long term (current) drug therapy: Secondary | ICD-10-CM | POA: Insufficient documentation

## 2016-12-22 DIAGNOSIS — L299 Pruritus, unspecified: Secondary | ICD-10-CM | POA: Insufficient documentation

## 2016-12-22 DIAGNOSIS — I10 Essential (primary) hypertension: Secondary | ICD-10-CM | POA: Insufficient documentation

## 2016-12-22 MED ORDER — BETAMETHASONE DIPROPIONATE 0.05 % EX OINT: TOPICAL_OINTMENT | Freq: Two times a day (BID) | CUTANEOUS | 0 refills | Status: DC

## 2016-12-22 NOTE — ED Notes (Signed)
EDP to see and assess patient prior to RN assessment. See EDP note.

## 2016-12-22 NOTE — ED Provider Notes (Signed)
[facility] DEPT Provider Note   CSN: [id number] Arrival date & time: 12/22/16  1115     History   Chief Complaint Chief Complaint  Patient presents with  . Rash    HPI Ronnie Burton is a 56 y.o. male.  HPI  56 y.o. male, presents to the Emergency Department today due to rash and itching to bilateral arms. Seen x 1 month ago for same. Pt states given PO steroid. No relief. States pain 4/10. Itching mostly. Only on flexor areas of elbow. No redness. No fevers. No purulence or weeping. No meds PTA. No new detergents. No new medications. No plant exposure. No other symptoms noted. .    Past Medical History:  Diagnosis Date  . Bipolar 1 disorder ([place])   . Headache   . Heart murmur   . Hypertension   . Schizophrenia [facility])     Patient Active Problem List   Diagnosis Date Noted  . Chest pain 02/23/2015  . Cigarette smoker 02/23/2015  . Pain in the chest   . Intravenous drug abuse   . Opioid use disorder, severe, dependence ([place]) 12/07/2014  . Hepatitis C 12/07/2014  . Schizoaffective disorder, bipolar type ([place]) 12/07/2014    History reviewed. No pertinent surgical history.     Home Medications    Prior to Admission medications   Medication Sig Start Date End Date Taking? Authorizing Provider  aluminum-magnesium hydroxide-simethicone (MAALOX) [phone] MG/5ML SUSP Take 30 mLs by mouth 4 (four) times daily -  before meals and at bedtime. 02/24/15   Jule Ser, DO  hydrOXYzine (ATARAX/VISTARIL) 25 MG tablet Take 1 tablet (25 mg total) by mouth every 6 (six) hours. 11/17/16   Recardo Evangelist, PA-C  ibuprofen (ADVIL,MOTRIN) 600 MG tablet Take 1 tablet (600 mg total) by mouth every 6 (six) hours as needed. 08/02/16   Domenic Moras, PA-C  QUEtiapine (SEROQUEL) 50 MG tablet Take 1 tablet (50 mg) twice daily & 3 tablets (150 mg) at bedtime: For mood control 12/13/14   Lindell Spar I, NP  traZODone (DESYREL) 150 MG tablet Take 1 tablet (150 mg total) by mouth at bedtime.  For sleep 12/13/14   Lindell Spar I, NP  triamcinolone cream (KENALOG) 0.1 % Apply 1 application topically 2 (two) times daily. 11/17/16   Recardo Evangelist, PA-C    Family History Family History  Problem Relation Age of Onset  . Alcoholism Father     Social History Social History  Substance Use Topics  . Smoking status: Current Every Day Smoker    Packs/day: 0.25    Years: 20.00    Types: Cigarettes  . Smokeless tobacco: Never Used  . Alcohol use Yes     Comment: occ     Allergies   Patient has no known allergies.   Review of Systems Review of Systems  Constitutional: Negative for fever.  Gastrointestinal: Negative for nausea.  Musculoskeletal: Negative for arthralgias and neck pain.  Skin: Positive for rash.  Allergic/Immunologic: Negative for immunocompromised state.   Physical Exam Updated Vital Signs BP 138/61 (BP Location: Right Arm)   Pulse 77   Temp 98.5 F (36.9 C)   Resp 16   Ht 6\' 5"  (1.956 m)   Wt 95.3 kg (210 lb)   SpO2 99%   BMI 24.90 kg/m   Physical Exam  Constitutional: He is oriented to person, place, and time. Vital signs are normal. He appears well-developed and well-nourished.  HENT:  Head: Normocephalic and atraumatic.  Right Ear: Hearing normal.  Left Ear: Hearing normal.  Eyes: Pupils are equal, round, and reactive to light. Conjunctivae and EOM are normal.  Neck: Normal range of motion. Neck supple.  Cardiovascular: Normal rate and regular rhythm.   Pulmonary/Chest: Effort normal.  Musculoskeletal: Normal range of motion.  Neurological: He is alert and oriented to person, place, and time.  Skin: Skin is warm and dry.  Scaling rash noted on flexor areas of elbow. Non erythematous. Non infectious. No erythema. No purulence or weeping.   Psychiatric: He has a normal mood and affect. His speech is normal and behavior is normal. Thought content normal.  Nursing note and vitals reviewed.  ED Treatments / Results  Labs (all labs  ordered are listed, but only abnormal results are displayed) Labs Reviewed - No data to display  EKG  EKG Interpretation None       Radiology No results found.  Procedures Procedures (including critical care time)  Medications Ordered in ED Medications - No data to display   Initial Impression / Assessment and Plan / ED Course  I have reviewed the triage vital signs and the nursing notes.  Pertinent labs & imaging results that were available during my care of the patient were reviewed by me and considered in my medical decision making (see chart for details).  Final Clinical Impressions(s) / ED Diagnoses     {I have reviewed the relevant previous healthcare records.  {I obtained HPI from historian.   ED Course:  Assessment: Pt is a 56 y.o. male  presents to the Emergency Department today due to rash and itching to bilateral arms. Seen x 1 month ago for same. Pt states given PO steroid. No relief. States pain 4/10. Itching mostly. Only on flexor areas of elbow. No redness. No fevers. No purulence or weeping. No meds PTA. No new detergents. No new medications. No plant exposure. . On exam, pt in NAD. Nontoxic/nonseptic appearing. VSS. Afebrile. Scaling rash noted on flexor areas of elbow. Non erythematous. Non infectious. No erythema. No purulence or weeping. Appears to be eczema. Given high potency steroids. Follow up to dermatology. Plan is to DC home. At time of discharge, Patient is in no acute distress. Vital Signs are stable. Patient is able to ambulate. Patient able to tolerate PO.   Disposition/Plan:  DC Home Additional Verbal discharge instructions given and discussed with patient.  Pt Instructed to f/u with PCP in the next week for evaluation and treatment of symptoms. Return precautions given Pt acknowledges and agrees with plan  Supervising Physician Gareth Morgan, MD  Final diagnoses:  Flexural eczema    New Prescriptions New Prescriptions   No medications  on file     Shary Decamp, Hershal Coria 12/22/16 1148    Gareth Morgan, MD 12/25/16 [phone]

## 2016-12-22 NOTE — Discharge Instructions (Signed)
Please read and follow all provided instructions.  Your diagnoses today include:  1. Flexural eczema     Tests performed today include: Vital signs. See below for your results today.   Medications prescribed:  Take as prescribed   Home care instructions:  Follow any educational materials contained in this packet.  Follow-up instructions: Please follow-up with dermatology for further evaluation of symptoms and treatment   Return instructions:  Please return to the Emergency Department if you do not get better, if you get worse, or new symptoms OR  - Fever (temperature greater than 101.57F)  - Bleeding that does not stop with holding pressure to the area    -Severe pain (please note that you may be more sore the day after your accident)  - Chest Pain  - Difficulty breathing  - Severe nausea or vomiting  - Inability to tolerate food and liquids  - Passing out  - Skin becoming red around your wounds  - Change in mental status (confusion or lethargy)  - New numbness or weakness    Please return if you have any other emergent concerns.  Additional Information:  Your vital signs today were: BP 138/61 (BP Location: Right Arm)    Pulse 77    Temp 98.5 F (36.9 C)    Resp 16    Ht 6\' 5"  (1.956 m)    Wt 95.3 kg (210 lb)    SpO2 99%    BMI 24.90 kg/m  If your blood pressure (BP) was elevated above 135/85 this visit, please have this repeated by your doctor within one month. ---------------

## 2016-12-22 NOTE — ED Notes (Signed)
Patient verbalized understanding of discharge instructions and denies any further needs or questions at this time. VS stable. Patient ambulatory with steady gait.  

## 2016-12-22 NOTE — ED Triage Notes (Signed)
Pt reports having rash and itching to bilateral arms, especially in bend of arms. Has been seen for same approx one month ago and given cream with no relief.

## 2017-09-14 ENCOUNTER — Emergency Department (HOSPITAL_COMMUNITY)
Admission: EM | Admit: 2017-09-14 | Discharge: 2017-09-14 | Disposition: A | Discharge status: Home / Self Care | Payer: Self-pay | Attending: Emergency Medicine | Admitting: Emergency Medicine

## 2017-09-14 ENCOUNTER — Other Ambulatory Visit: Payer: Self-pay

## 2017-09-14 ENCOUNTER — Encounter (HOSPITAL_COMMUNITY): Payer: Self-pay | Admitting: *Deleted

## 2017-09-14 ENCOUNTER — Emergency Department (HOSPITAL_COMMUNITY): Payer: Self-pay

## 2017-09-14 DIAGNOSIS — Y929 Unspecified place or not applicable: Secondary | ICD-10-CM | POA: Insufficient documentation

## 2017-09-14 DIAGNOSIS — W269XXA Contact with unspecified sharp object(s), initial encounter: Secondary | ICD-10-CM | POA: Insufficient documentation

## 2017-09-14 DIAGNOSIS — I10 Essential (primary) hypertension: Secondary | ICD-10-CM | POA: Insufficient documentation

## 2017-09-14 DIAGNOSIS — F17210 Nicotine dependence, cigarettes, uncomplicated: Secondary | ICD-10-CM | POA: Insufficient documentation

## 2017-09-14 DIAGNOSIS — Y999 Unspecified external cause status: Secondary | ICD-10-CM | POA: Insufficient documentation

## 2017-09-14 DIAGNOSIS — S81812A Laceration without foreign body, left lower leg, initial encounter: Secondary | ICD-10-CM | POA: Insufficient documentation

## 2017-09-14 DIAGNOSIS — Z79899 Other long term (current) drug therapy: Secondary | ICD-10-CM | POA: Insufficient documentation

## 2017-09-14 DIAGNOSIS — Y939 Activity, unspecified: Secondary | ICD-10-CM | POA: Insufficient documentation

## 2017-09-14 MED ORDER — CEPHALEXIN 500 MG PO CAPS: 500.0000 mg | ORAL_CAPSULE | Freq: Four times a day (QID) | ORAL | 0 refills | Status: DC

## 2017-09-14 MED ORDER — TETANUS-DIPHTH-ACELL PERTUSSIS 5-2.5-18.5 LF-MCG/0.5 IM SUSP
0.5000 mL | Freq: Once | INTRAMUSCULAR | Status: AC
  Administered 2017-09-14: 0.5 mL via INTRAMUSCULAR
  Filled 2017-09-14: qty 0.5

## 2017-09-14 NOTE — ED Notes (Signed)
Patient Alert and oriented to baseline. Stable and ambulatory to baseline. Patient verbalized understanding of the discharge instructions.  Patient belongings were taken by the patient.   

## 2017-09-14 NOTE — Discharge Instructions (Addendum)
Wash wound with warm soapy water 1-2 times daily.  Apply antibiotic ointment and keep covered.  Please return to emergency department if you develop any increasing pain, redness, swelling, yellow, thick drainage, or fever.

## 2017-09-14 NOTE — ED Triage Notes (Signed)
Pt has laceration to left lower leg. Reports injuring his leg with chainsaw while at work last night around 5:30pm. No bleeding is noted at this time.

## 2017-09-14 NOTE — ED Provider Notes (Signed)
[facility] EMERGENCY DEPARTMENT Provider Note   CSN: [id number] Arrival date & time: 09/14/17  1419     History   Chief Complaint Chief Complaint  Patient presents with  . Laceration    HPI Ronnie Burton is a 57 y.o. male with history of schizophrenia, bipolar 1 disorder, hypertension who presents with a 1 day history of left lower leg laceration.  He reports the laceration occurred about 5:30 PM yesterday.  He has been washing it and keeping it covered at home.  He denies any significant pain to his leg other than around the wound.  His tetanus is not up-to-date.  He denies any numbness or tingling.  He has no problems ambulating.  HPI  Past Medical History:  Diagnosis Date  . Bipolar 1 disorder ([place])   . Headache   . Heart murmur   . Hypertension   . Schizophrenia [facility])     Patient Active Problem List   Diagnosis Date Noted  . Chest pain 02/23/2015  . Cigarette smoker 02/23/2015  . Pain in the chest   . Intravenous drug abuse ([place])   . Opioid use disorder, severe, dependence ([place]) 12/07/2014  . Hepatitis C 12/07/2014  . Schizoaffective disorder, bipolar type ([place]) 12/07/2014    History reviewed. No pertinent surgical history.      Home Medications    Prior to Admission medications   Medication Sig Start Date End Date Taking? Authorizing Provider  aluminum-magnesium hydroxide-simethicone (MAALOX) [phone] MG/5ML SUSP Take 30 mLs by mouth 4 (four) times daily -  before meals and at bedtime. 02/24/15   Jule Ser, DO  betamethasone dipropionate (DIPROLENE) 0.05 % ointment Apply topically 2 (two) times daily. For 2 weeks until resolved 12/22/16   Shary Decamp, PA-C  cephALEXin (KEFLEX) 500 MG capsule Take 1 capsule (500 mg total) by mouth 4 (four) times daily. 09/14/17   Randall Rampersad, Bea Graff, PA-C  hydrOXYzine (ATARAX/VISTARIL) 25 MG tablet Take 1 tablet (25 mg total) by mouth every 6 (six) hours. 11/17/16   Recardo Evangelist, PA-C  ibuprofen  (ADVIL,MOTRIN) 600 MG tablet Take 1 tablet (600 mg total) by mouth every 6 (six) hours as needed. 08/02/16   Domenic Moras, PA-C  QUEtiapine (SEROQUEL) 50 MG tablet Take 1 tablet (50 mg) twice daily & 3 tablets (150 mg) at bedtime: For mood control 12/13/14   Lindell Spar I, NP  traZODone (DESYREL) 150 MG tablet Take 1 tablet (150 mg total) by mouth at bedtime. For sleep 12/13/14   Lindell Spar I, NP  triamcinolone cream (KENALOG) 0.1 % Apply 1 application topically 2 (two) times daily. 11/17/16   Recardo Evangelist, PA-C    Family History Family History  Problem Relation Age of Onset  . Alcoholism Father     Social History Social History   Tobacco Use  . Smoking status: Current Every Day Smoker    Packs/day: 0.25    Years: 20.00    Pack years: 5.00    Types: Cigarettes  . Smokeless tobacco: Never Used  Substance Use Topics  . Alcohol use: Yes    Comment: occ  . Drug use: No     Allergies   Patient has no known allergies.   Review of Systems Review of Systems  Musculoskeletal: Negative for arthralgias.  Skin: Positive for wound.  Neurological: Negative for numbness.     Physical Exam Updated Vital Signs BP (!) 141/69   Pulse 70   Temp 98 F (36.7 C) (Oral)   Resp  18   SpO2 99%   Physical Exam  Constitutional: He appears well-developed and well-nourished. No distress.  HENT:  Head: Normocephalic and atraumatic.  Mouth/Throat: Oropharynx is clear and moist. No oropharyngeal exudate.  Eyes: Pupils are equal, round, and reactive to light. Conjunctivae are normal. Right eye exhibits no discharge. Left eye exhibits no discharge. No scleral icterus.  Neck: Normal range of motion. Neck supple. No thyromegaly present.  Cardiovascular: Normal rate, regular rhythm, normal heart sounds and intact distal pulses. Exam reveals no gallop and no friction rub.  No murmur heard. Pulmonary/Chest: Effort normal and breath sounds normal. No stridor. No respiratory distress. He has no  wheezes. He has no rales.  Musculoskeletal: He exhibits no edema.  Lymphadenopathy:    He has no cervical adenopathy.  Neurological: He is alert. Coordination normal.  Skin: Skin is warm and dry. No rash noted. He is not diaphoretic. No pallor.  Superficial laceration to left anterior lower leg, no extension to muscle belly, full range of motion of ankle, sensation intact, DP pulses intact, no active bleeding, see photo  Psychiatric: He has a normal mood and affect.  Nursing note and vitals reviewed.      ED Treatments / Results  Labs (all labs ordered are listed, but only abnormal results are displayed) Labs Reviewed - No data to display  EKG None  Radiology Dg Tibia/fibula Left  Result Date: 09/14/2017 CLINICAL DATA:  Left lower leg laceration following a chainsaw injury last night. EXAM: LEFT TIBIA AND FIBULA - 2 VIEW COMPARISON:  None. FINDINGS: Small soft tissue defect in the anterior aspect of the distal lower leg with mild underlying soft tissue swelling. No soft tissue gas, fracture, bone destruction or periosteal reaction. Mild atheromatous arterial calcifications. Mild calcaneal spur formation. IMPRESSION: Anterior soft tissue laceration without underlying fracture or radiopaque foreign body. Electronically Signed   By: Claudie Revering M.D.   On: 09/14/2017 18:01    Procedures Procedures (including critical care time)  Medications Ordered in ED Medications  Tdap (BOOSTRIX) injection 0.5 mL (0.5 mLs Intramuscular Given 09/14/17 1816)     Initial Impression / Assessment and Plan / ED Course  I have reviewed the triage vital signs and the nursing notes.  Pertinent labs & imaging results that were available during my care of the patient were reviewed by me and considered in my medical decision making (see chart for details).     Patient with superficial laceration to left lower leg after chainsaw injury over 24 hours.  X-ray is negative for fracture or foreign body.   Wound is very well-appearing without active bleeding and do not feel comfortable suturing at this time.  No signs of infection.  Some serous drainage.  Will treat with Keflex for prophylaxis.  Tetanus updated in the ED.  Wound care discussed.  Strict return precautions given.  Patient understands and agrees with plan.  Patient vitals stable throughout ED course and discharged in satisfactory condition.  Final Clinical Impressions(s) / ED Diagnoses   Final diagnoses:  Laceration of left lower extremity, initial encounter    ED Discharge Orders        Ordered    cephALEXin (KEFLEX) 500 MG capsule  4 times daily     09/14/17 1833       Frederica Kuster, PA-C 09/14/17 2355    Charlesetta Shanks, MD 09/24/17 1442

## 2018-12-24 ENCOUNTER — Ambulatory Visit: Payer: Medicaid Other | Admitting: Podiatry

## 2018-12-24 ENCOUNTER — Ambulatory Visit (INDEPENDENT_AMBULATORY_CARE_PROVIDER_SITE_OTHER): Payer: Medicaid Other

## 2018-12-24 ENCOUNTER — Other Ambulatory Visit: Payer: Self-pay | Admitting: Podiatry

## 2018-12-24 ENCOUNTER — Other Ambulatory Visit: Payer: Self-pay

## 2018-12-24 DIAGNOSIS — L989 Disorder of the skin and subcutaneous tissue, unspecified: Secondary | ICD-10-CM

## 2018-12-24 DIAGNOSIS — M79671 Pain in right foot: Secondary | ICD-10-CM

## 2018-12-24 DIAGNOSIS — G629 Polyneuropathy, unspecified: Secondary | ICD-10-CM

## 2018-12-24 DIAGNOSIS — D492 Neoplasm of unspecified behavior of bone, soft tissue, and skin: Secondary | ICD-10-CM | POA: Diagnosis not present

## 2018-12-24 DIAGNOSIS — M79672 Pain in left foot: Secondary | ICD-10-CM

## 2018-12-24 MED ORDER — GABAPENTIN 100 MG PO CAPS: 100.0000 mg | ORAL_CAPSULE | Freq: Three times a day (TID) | ORAL | 3 refills | Status: DC

## 2018-12-28 NOTE — Progress Notes (Signed)
   Subjective: 58 y.o. male presenting to the office today as a new patient with a chief complaint of multiple painful callus lesions noted to the plantar aspects of the bilateral feet that have been present for the past 4-5 months. He also reports associated sharp pain and intermittent numbness at night. Walking and sleeping cause the pain to worsen. He has been wearing slippers and soaking the feet in Epsom salt for treatment. Patient is here for further evaluation and treatment.   Past Medical History:  Diagnosis Date  . Bipolar 1 disorder ([place])   . Headache   . Heart murmur   . Hypertension   . Schizophrenia ([place])      Objective:  Physical Exam General: Alert and oriented x3 in no acute distress  Dermatology: Hyperkeratotic lesion(s) present on the bilateral feet. Pain on palpation with a central nucleated core noted. Skin is warm, dry and supple bilateral lower extremities. Negative for open lesions or macerations.  Vascular: Palpable pedal pulses bilaterally. No edema or erythema noted. Capillary refill within normal limits.  Neurological: Epicritic and protective threshold diminished bilaterally.   Musculoskeletal Exam: Pain on palpation at the keratotic lesion(s) noted. Range of motion within normal limits bilateral. Muscle strength 5/5 in all groups bilateral.  Radiographic Exam:  Normal osseous mineralization. Joint spaces preserved. No fracture/dislocation/boney destruction.    Assessment: 1. Peripheral neuropathy BLE 2. Pre-ulcerative callus lesions bilateral x 5   Plan of Care:  1. Patient evaluated. X-Rays reviewed.  2. Excisional debridement of keratoic lesion(s) using a chisel blade was performed without incident.  3. Dressed area with light dressing. 4. Prescription for Gabapentin 100 mg TID provided to patient.  5. Recommended good shoe gear.  6. Patient is to return to the clinic PRN.   Edrick Kins, DPM [facility]  Dr. Edrick Kins,  [address]                                        [place], [place] [address]                Office [phone]  Fax [phone]

## 2019-03-31 ENCOUNTER — Other Ambulatory Visit: Payer: Self-pay | Admitting: Nurse Practitioner

## 2019-03-31 DIAGNOSIS — B182 Chronic viral hepatitis C: Secondary | ICD-10-CM

## 2019-04-13 ENCOUNTER — Other Ambulatory Visit: Payer: Self-pay

## 2019-04-13 ENCOUNTER — Ambulatory Visit
Admission: RE | Admit: 2019-04-13 | Discharge: 2019-04-13 | Disposition: A | Discharge status: Home / Self Care | Payer: Medicaid Other | Source: Ambulatory Visit | Attending: Nurse Practitioner | Admitting: Nurse Practitioner

## 2019-04-13 DIAGNOSIS — B182 Chronic viral hepatitis C: Secondary | ICD-10-CM

## 2019-08-14 ENCOUNTER — Other Ambulatory Visit: Payer: Self-pay

## 2019-08-14 ENCOUNTER — Encounter: Payer: Self-pay | Admitting: Podiatry

## 2019-08-14 ENCOUNTER — Ambulatory Visit: Payer: Medicaid Other | Admitting: Podiatry

## 2019-08-14 VITALS — Temp 96.7°F

## 2019-08-14 DIAGNOSIS — L989 Disorder of the skin and subcutaneous tissue, unspecified: Secondary | ICD-10-CM | POA: Diagnosis not present

## 2019-08-14 DIAGNOSIS — G629 Polyneuropathy, unspecified: Secondary | ICD-10-CM | POA: Diagnosis not present

## 2019-08-14 NOTE — Progress Notes (Signed)
This patient presents the office today for continued evaluation and treatment of multiple skin lesions on the bottom of both feet.  He says these calluses are painful walking wearing his shoes.  He says he experiences sharp radiating pain at the site of the callus.  He presents the office today for continued evaluation and treatment of his painful callus.    Vascular  Dorsalis pedis and posterior tibial pulses are palpable  B/L.  Capillary return  WNL.  Temperature gradient is  WNL.  Skin turgor  WNL  Sensorium  Senn Weinstein monofilament wire  WNL. Normal tactile sensation.  Nail Exam  Patient has normal nails with no evidence of bacterial or fungal infection.  Orthopedic  Exam  Muscle tone and muscle strength  WNL.  No limitations of motion feet  B/L.  No crepitus or joint effusion noted.  Foot type is unremarkable and digits show no abnormalities.  Bony prominences are unremarkable.  Skin  No open lesions.  Normal skin texture and turgor.  Callus  Hallux  B/L.  Callus sub 5th metabase  B/L.  Porokeratosis forefoot  B/L.  Benign skin lesion.  Porokeratotic lesions  B/L.  Debridement of skin lesions with # 15 blade.   Told to use pumice stone.  RTC prn   Gardiner Barefoot DPM

## 2020-01-08 ENCOUNTER — Other Ambulatory Visit: Payer: Self-pay | Admitting: Nurse Practitioner

## 2020-01-08 DIAGNOSIS — K7460 Unspecified cirrhosis of liver: Secondary | ICD-10-CM

## 2020-01-15 ENCOUNTER — Ambulatory Visit
Admission: RE | Admit: 2020-01-15 | Discharge: 2020-01-15 | Disposition: A | Discharge status: Home / Self Care | Payer: Medicaid Other | Source: Ambulatory Visit | Attending: Nurse Practitioner | Admitting: Nurse Practitioner

## 2020-01-15 DIAGNOSIS — K7460 Unspecified cirrhosis of liver: Secondary | ICD-10-CM

## 2020-05-11 ENCOUNTER — Other Ambulatory Visit: Payer: Self-pay | Admitting: Internal Medicine

## 2020-05-12 LAB — SARS-COV-2 RNA,(COVID-19) QUALITATIVE NAAT: SARS CoV2 RNA: NOT DETECTED

## 2020-06-01 ENCOUNTER — Other Ambulatory Visit: Payer: Self-pay | Admitting: Nurse Practitioner

## 2020-06-01 DIAGNOSIS — K7402 Hepatic fibrosis, advanced fibrosis: Secondary | ICD-10-CM

## 2020-06-16 ENCOUNTER — Other Ambulatory Visit: Payer: Medicaid Other

## 2020-06-16 ENCOUNTER — Ambulatory Visit
Admission: RE | Admit: 2020-06-16 | Discharge: 2020-06-16 | Disposition: A | Discharge status: Home / Self Care | Payer: Medicaid Other | Source: Ambulatory Visit | Attending: Nurse Practitioner | Admitting: Nurse Practitioner

## 2020-06-16 DIAGNOSIS — K7402 Hepatic fibrosis, advanced fibrosis: Secondary | ICD-10-CM

## 2020-07-29 ENCOUNTER — Encounter: Payer: Self-pay | Admitting: Podiatry

## 2020-07-29 ENCOUNTER — Other Ambulatory Visit: Payer: Self-pay

## 2020-07-29 ENCOUNTER — Ambulatory Visit (INDEPENDENT_AMBULATORY_CARE_PROVIDER_SITE_OTHER): Payer: Medicaid Other | Admitting: Podiatry

## 2020-07-29 DIAGNOSIS — L989 Disorder of the skin and subcutaneous tissue, unspecified: Secondary | ICD-10-CM

## 2020-07-29 DIAGNOSIS — L84 Corns and callosities: Secondary | ICD-10-CM | POA: Insufficient documentation

## 2020-07-29 NOTE — Progress Notes (Signed)
This patient presents the office today for continued evaluation and treatment of multiple skin lesions on the bottom of both feet.  He says these calluses are painful walking wearing his shoes.  He says he experiences sharp radiating pain at the site of the callus.  He presents the office today for continued evaluation and treatment of his painful callus.    Vascular  Dorsalis pedis and posterior tibial pulses are palpable  B/L.  Capillary return  WNL.  Temperature gradient is  WNL.  Skin turgor  WNL  Sensorium  Senn Weinstein monofilament wire  absent. Normal tactile sensation.  Nail Exam  Patient has normal nails with no evidence of bacterial or fungal infection.  Orthopedic  Exam  Muscle tone and muscle strength  WNL.  No limitations of motion feet  B/L.  No crepitus or joint effusion noted.  Foot type is unremarkable and digits show no abnormalities.  HAV  B/L.  DJD midfoot  B/L.  Skin  No open lesions.  Normal skin texture and turgor.    Callus sub 5th metabase  B/L.    Benign skin lesion.  Porokeratotic lesions  B/L.  Debridement of skin lesions with # 15 blade.   Told to use pumice stone.   To consider capacian.. To consider diabetic shoes.  RTC 2 weeks   Gardiner Barefoot DPM

## 2020-08-12 ENCOUNTER — Encounter: Payer: Self-pay | Admitting: Podiatry

## 2020-08-12 ENCOUNTER — Ambulatory Visit: Payer: Medicaid Other | Admitting: Podiatry

## 2020-08-12 ENCOUNTER — Other Ambulatory Visit: Payer: Self-pay

## 2020-08-12 DIAGNOSIS — L0889 Other specified local infections of the skin and subcutaneous tissue: Secondary | ICD-10-CM

## 2020-08-12 DIAGNOSIS — L989 Disorder of the skin and subcutaneous tissue, unspecified: Secondary | ICD-10-CM | POA: Diagnosis not present

## 2020-08-12 DIAGNOSIS — G629 Polyneuropathy, unspecified: Secondary | ICD-10-CM

## 2020-08-12 DIAGNOSIS — L84 Corns and callosities: Secondary | ICD-10-CM

## 2020-08-12 NOTE — Progress Notes (Signed)
This patient presents the office today for continued evaluation and treatment of multiple skin lesions on the bottom of both feet.  He says these calluses are painful walking wearing his shoes.  He says he experiences sharp radiating pain at the site of the callus.  He presents the office today for continued evaluation and treatment of his painful callus. He was seen 2 weeks ago for a foot evaluation.  He was told to make an appointment with Dr.  Posey Pronto for capaisain treatment.  He wishes to discuss diabetic shoes/off weightbearing orthoses..  Vascular  Dorsalis pedis and posterior tibial pulses are palpable  B/L.  Capillary return  WNL.  Temperature gradient is  WNL.  Skin turgor  WNL  Sensorium  Senn Weinstein monofilament wire  absent. Normal tactile sensation.  Nail Exam  Patient has normal nails with no evidence of bacterial or fungal infection.  Orthopedic  Exam  Muscle tone and muscle strength  WNL.  No limitations of motion feet  B/L.  No crepitus or joint effusion noted.  Foot type is unremarkable and digits show no abnormalities.  HAV  B/L.  DJD midfoot  B/L.  Skin  No open lesions.  Normal skin texture and turgor.    Callus sub 5th metabase  B/L.  Callus forefoot  B/L.  Benign skin lesion.  Porokeratotic lesions  B/L.  Debridement of skin lesions with # 15 blade.   Told to use pumice stone.   Make an appointment with Dr.  Posey Pronto.  Told to acquire diabetic shoes/orthoses to off weight bear calluses..    RTC prn   Gardiner Barefoot DPM

## 2020-09-05 ENCOUNTER — Other Ambulatory Visit: Payer: Self-pay

## 2020-09-05 ENCOUNTER — Ambulatory Visit (INDEPENDENT_AMBULATORY_CARE_PROVIDER_SITE_OTHER): Payer: Medicaid Other | Admitting: Podiatry

## 2020-09-05 ENCOUNTER — Encounter: Payer: Self-pay | Admitting: Podiatry

## 2020-09-05 DIAGNOSIS — L84 Corns and callosities: Secondary | ICD-10-CM

## 2020-09-05 DIAGNOSIS — G629 Polyneuropathy, unspecified: Secondary | ICD-10-CM

## 2020-09-05 DIAGNOSIS — L989 Disorder of the skin and subcutaneous tissue, unspecified: Secondary | ICD-10-CM

## 2020-09-05 NOTE — Progress Notes (Signed)
Patient was to be seen by Dr.  Posey Pronto for neuropathy and callus care.  He was rescheduled on my schedule.  Told patient he needs to be scheduled with Dr.  Posey Pronto.  Callus  Neuropathy.  RTC  See Dr.  Posey Pronto

## 2020-09-09 ENCOUNTER — Encounter (HOSPITAL_COMMUNITY): Payer: Self-pay

## 2020-09-09 ENCOUNTER — Ambulatory Visit (HOSPITAL_COMMUNITY)
Admission: EM | Admit: 2020-09-09 | Discharge: 2020-09-09 | Disposition: A | Discharge status: Home / Self Care | Payer: Medicaid Other | Attending: Family Medicine | Admitting: Family Medicine

## 2020-09-09 ENCOUNTER — Other Ambulatory Visit: Payer: Self-pay

## 2020-09-09 DIAGNOSIS — F17210 Nicotine dependence, cigarettes, uncomplicated: Secondary | ICD-10-CM

## 2020-09-09 DIAGNOSIS — J069 Acute upper respiratory infection, unspecified: Secondary | ICD-10-CM

## 2020-09-09 MED ORDER — PREDNISONE 20 MG PO TABS: 40.0000 mg | ORAL_TABLET | Freq: Every day | ORAL | 0 refills | Status: DC

## 2020-09-09 MED ORDER — ALBUTEROL SULFATE HFA 108 (90 BASE) MCG/ACT IN AERS: 1.0000 | INHALATION_SPRAY | Freq: Four times a day (QID) | RESPIRATORY_TRACT | 0 refills | Status: DC | PRN

## 2020-09-09 MED ORDER — AZITHROMYCIN 250 MG PO TABS: ORAL_TABLET | ORAL | 0 refills | Status: DC

## 2020-09-09 NOTE — ED Triage Notes (Signed)
Pt reports cough and chest congestion x 2 weeks. OTC cough and flu meds gives no relief.

## 2020-09-09 NOTE — ED Provider Notes (Signed)
[facility]    CSN: [id number] Arrival date & time: 09/09/20  0827      History   Chief Complaint Chief Complaint  Patient presents with  . Cough    HPI Ronnie Burton is a 60 y.o. male.   Patient presenting today with 2-week history of worsening productive cough, chest pain with coughing, wheezing.  He denies sore throat, congestion, fever, chills, shortness of breath, abdominal pain, nausea vomiting diarrhea.  No new sick contacts.  Has not had any COVID or flu testing since onset.  Taking TheraFlu with minimal relief.  Denies any history of pulmonary disease, longtime cigarette smoker currently smoking half pack a day.  States he is never required an inhaler in his lifetime.     Past Medical History:  Diagnosis Date  . Bipolar 1 disorder ([place])   . Headache   . Heart murmur   . Hypertension   . Schizophrenia [facility])     Patient Active Problem List   Diagnosis Date Noted  . Callus 07/29/2020  . Chest pain 02/23/2015  . Cigarette smoker 02/23/2015  . Pain in the chest   . Intravenous drug abuse ([place])   . Opioid use disorder, severe, dependence ([place]) 12/07/2014  . Hepatitis C 12/07/2014  . Schizoaffective disorder, bipolar type ([place]) 12/07/2014    History reviewed. No pertinent surgical history.     Home Medications    Prior to Admission medications   Medication Sig Start Date End Date Taking? Authorizing Provider  albuterol (VENTOLIN HFA) 108 (90 Base) MCG/ACT inhaler Inhale 1-2 puffs into the lungs every 6 (six) hours as needed for wheezing or shortness of breath. 09/09/20  Yes Volney American, PA-C  azithromycin (ZITHROMAX Z-PAK) 250 MG tablet Take 2 tabs day one, then 1 tab daily until complete 09/09/20  Yes Volney American, PA-C  predniSONE (DELTASONE) 20 MG tablet Take 2 tablets (40 mg total) by mouth daily with breakfast. 09/09/20  Yes Volney American, PA-C  aluminum-magnesium hydroxide-simethicone (MAALOX) 200-200-20 MG/5ML  SUSP Take 30 mLs by mouth 4 (four) times daily -  before meals and at bedtime. 02/24/15   Mignon Pine, DO  betamethasone dipropionate (DIPROLENE) 0.05 % ointment Apply topically 2 (two) times daily. For 2 weeks until resolved 12/22/16   Shary Decamp, PA-C  cephALEXin (KEFLEX) 500 MG capsule Take 1 capsule (500 mg total) by mouth 4 (four) times daily. 09/14/17   Law, Bea Graff, PA-C  gabapentin (NEURONTIN) 100 MG capsule Take 1 capsule (100 mg total) by mouth 3 (three) times daily. 12/24/18   Edrick Kins, DPM  hydrOXYzine (ATARAX/VISTARIL) 25 MG tablet Take 1 tablet (25 mg total) by mouth every 6 (six) hours. 11/17/16   Recardo Evangelist, PA-C  ibuprofen (ADVIL,MOTRIN) 600 MG tablet Take 1 tablet (600 mg total) by mouth every 6 (six) hours as needed. 08/02/16   Domenic Moras, PA-C  lamoTRIgine (LAMICTAL) 25 MG tablet Take 50 mg by mouth 2 (two) times daily. 03/15/20   [provider]  QUEtiapine (SEROQUEL) 50 MG tablet Take 1 tablet (50 mg) twice daily & 3 tablets (150 mg) at bedtime: For mood control 12/13/14   Lindell Spar I, NP  tiZANidine (ZANAFLEX) 4 MG tablet Take 4 mg by mouth 2 (two) times daily as needed. 12/19/18   [provider]  traZODone (DESYREL) 150 MG tablet Take 1 tablet (150 mg total) by mouth at bedtime. For sleep 12/13/14   Lindell Spar I, NP  triamcinolone cream (KENALOG) 0.1 %  Apply 1 application topically 2 (two) times daily. 11/17/16   Recardo Evangelist, PA-C  Vitamin D, Ergocalciferol, (DRISDOL) 1.25 MG (50000 UNIT) CAPS capsule Take 1 capsule by mouth once a week. 05/25/20   [provider]    Family History Family History  Problem Relation Age of Onset  . Alcoholism Father     Social History Social History   Tobacco Use  . Smoking status: Current Every Day Smoker    Packs/day: 0.25    Years: 20.00    Pack years: 5.00    Types: Cigarettes  . Smokeless tobacco: Never Used  Substance Use Topics  . Alcohol use: Yes    Comment: occ  . Drug  use: No     Allergies   Patient has no known allergies.   Review of Systems Review of Systems Per HPI Physical Exam Triage Vital Signs ED Triage Vitals  Enc Vitals Group     BP 09/09/20 0857 124/77     Pulse Rate 09/09/20 0857 67     Resp 09/09/20 0857 20     Temp 09/09/20 0857 98.5 F (36.9 C)     Temp Source 09/09/20 0857 Oral     SpO2 09/09/20 0857 97 %     Weight --      Height --      Head Circumference --      Peak Flow --      Pain Score 09/09/20 0853 0     Pain Loc --      Pain Edu? --      Excl. in [place]? --    No data found.  Updated Vital Signs BP 124/77 (BP Location: Right Arm)   Pulse 67   Temp 98.5 F (36.9 C) (Oral)   Resp 20   SpO2 97%   Visual Acuity Right Eye Distance:   Left Eye Distance:   Bilateral Distance:    Right Eye Near:   Left Eye Near:    Bilateral Near:     Physical Exam Vitals and nursing note reviewed.  Constitutional:      Appearance: Normal appearance.  HENT:     Head: Atraumatic.  Eyes:     Extraocular Movements: Extraocular movements intact.     Conjunctiva/sclera: Conjunctivae normal.  Cardiovascular:     Rate and Rhythm: Normal rate and regular rhythm.  Pulmonary:     Effort: Pulmonary effort is normal. No respiratory distress.     Breath sounds: Wheezing present. No rales.     Comments: Moderate diffuse, worse at bases.  Breath sounds mildly decreased throughout Musculoskeletal:        General: Normal range of motion.     Cervical back: Normal range of motion and neck supple.  Skin:    General: Skin is warm and dry.  Neurological:     General: No focal deficit present.     Mental Status: He is oriented to person, place, and time.  Psychiatric:        Mood and Affect: Mood normal.        Thought Content: Thought content normal.        Judgment: Judgment normal.      UC Treatments / Results  Labs (all labs ordered are listed, but only abnormal results are displayed) Labs Reviewed - No data to  display  EKG   Radiology No results found.  Procedures Procedures (including critical care time)  Medications Ordered in UC Medications - No data to display  Initial  Impression / Assessment and Plan / UC Course  I have reviewed the triage vital signs and the nursing notes.  Pertinent labs & imaging results that were available during my care of the patient were reviewed by me and considered in my medical decision making (see chart for details).     Treat with azithromycin, prednisone burst, albuterol inhaler as needed.  Discussed trying Mucinex twice daily as well.  Suspect some underlying COPD given presentation, recommended he follow-up with primary care provider for spirometry testing and lung recheck after completing his course of therapy.  Smoking cessation reviewed at length.  Patient not ready to do this at this time.  Discussed risks of ongoing smoking.  Final Clinical Impressions(s) / UC Diagnoses   Final diagnoses:  Upper respiratory tract infection, unspecified type  Cigarette smoker   Discharge Instructions   None    ED Prescriptions    Medication Sig Dispense Auth. Provider   albuterol (VENTOLIN HFA) 108 (90 Base) MCG/ACT inhaler Inhale 1-2 puffs into the lungs every 6 (six) hours as needed for wheezing or shortness of breath. 18 g Volney American, PA-C   azithromycin (ZITHROMAX Z-PAK) 250 MG tablet Take 2 tabs day one, then 1 tab daily until complete 6 tablet Volney American, PA-C   predniSONE (DELTASONE) 20 MG tablet Take 2 tablets (40 mg total) by mouth daily with breakfast. 10 tablet Volney American, [place]     PDMP not reviewed this encounter.   Volney American, [place] 09/09/20 1050

## 2020-09-16 ENCOUNTER — Ambulatory Visit: Payer: Medicaid Other | Admitting: Podiatry

## 2020-09-16 ENCOUNTER — Other Ambulatory Visit: Payer: Self-pay

## 2020-09-16 DIAGNOSIS — E119 Type 2 diabetes mellitus without complications: Secondary | ICD-10-CM

## 2020-09-20 ENCOUNTER — Encounter: Payer: Self-pay | Admitting: Podiatry

## 2020-09-20 NOTE — Progress Notes (Signed)
Subjective: Ronnie Burton presents today referred by Nolene Ebbs, MD for diabetic foot evaluation.  Patient relates less than 1 year history of diabetes.  Patient denies any history of foot wounds.  Patient denies any history of numbness, tingling, burning, pins/needles sensations.  Past Medical History:  Diagnosis Date  . Bipolar 1 disorder ([place])   . Headache   . Heart murmur   . Hypertension   . Schizophrenia [facility])     Patient Active Problem List   Diagnosis Date Noted  . Callus 07/29/2020  . Chest pain 02/23/2015  . Cigarette smoker 02/23/2015  . Pain in the chest   . Intravenous drug abuse ([place])   . Opioid use disorder, severe, dependence ([place]) 12/07/2014  . Hepatitis C 12/07/2014  . Schizoaffective disorder, bipolar type ([place]) 12/07/2014    No past surgical history on file.  Current Outpatient Medications on File Prior to Visit  Medication Sig Dispense Refill  . albuterol (VENTOLIN HFA) 108 (90 Base) MCG/ACT inhaler Inhale 1-2 puffs into the lungs every 6 (six) hours as needed for wheezing or shortness of breath. 18 g 0  . aluminum-magnesium hydroxide-simethicone (MAALOX) 545-625-63 MG/5ML SUSP Take 30 mLs by mouth 4 (four) times daily -  before meals and at bedtime. 1120 mL 0  . azithromycin (ZITHROMAX Z-PAK) 250 MG tablet Take 2 tabs day one, then 1 tab daily until complete 6 tablet 0  . betamethasone dipropionate (DIPROLENE) 0.05 % ointment Apply topically 2 (two) times daily. For 2 weeks until resolved 45 g 0  . cephALEXin (KEFLEX) 500 MG capsule Take 1 capsule (500 mg total) by mouth 4 (four) times daily. 20 capsule 0  . gabapentin (NEURONTIN) 100 MG capsule Take 1 capsule (100 mg total) by mouth 3 (three) times daily. 90 capsule 3  . hydrOXYzine (ATARAX/VISTARIL) 25 MG tablet Take 1 tablet (25 mg total) by mouth every 6 (six) hours. 30 tablet 0  . ibuprofen (ADVIL,MOTRIN) 600 MG tablet Take 1 tablet (600 mg total) by mouth every 6 (six) hours as needed. 30  tablet 0  . lamoTRIgine (LAMICTAL) 25 MG tablet Take 50 mg by mouth 2 (two) times daily.    . predniSONE (DELTASONE) 20 MG tablet Take 2 tablets (40 mg total) by mouth daily with breakfast. 10 tablet 0  . QUEtiapine (SEROQUEL) 50 MG tablet Take 1 tablet (50 mg) twice daily & 3 tablets (150 mg) at bedtime: For mood control 150 tablet 0  . tiZANidine (ZANAFLEX) 4 MG tablet Take 4 mg by mouth 2 (two) times daily as needed.    . traZODone (DESYREL) 150 MG tablet Take 1 tablet (150 mg total) by mouth at bedtime. For sleep 30 tablet 0  . triamcinolone cream (KENALOG) 0.1 % Apply 1 application topically 2 (two) times daily. 453.6 g 0  . Vitamin D, Ergocalciferol, (DRISDOL) 1.25 MG (50000 UNIT) CAPS capsule Take 1 capsule by mouth once a week.     No current facility-administered medications on file prior to visit.     No Known Allergies  Social History   Occupational History  . Not on file  Tobacco Use  . Smoking status: Current Every Day Smoker    Packs/day: 0.25    Years: 20.00    Pack years: 5.00    Types: Cigarettes  . Smokeless tobacco: Never Used  Substance and Sexual Activity  . Alcohol use: Yes    Comment: occ  . Drug use: No  . Sexual activity: Yes    Family History  Problem Relation Age of Onset  . Alcoholism Father     Immunization History  Administered Date(s) Administered  . Influenza-Unspecified 12/21/2014  . Tdap 09/14/2017    Review of systems: Positive Findings in bold print.  Constitutional:  chills, fatigue, fever, sweats, weight change Communication: Optometrist, sign Ecologist, hand writing, iPad/Android device Head: headaches, head injury Eyes: changes in vision, eye pain, glaucoma, cataracts, macular degeneration, diplopia, glare,  light sensitivity, eyeglasses or contacts, blindness Ears nose mouth throat: hearing impaired, hearing aids,  ringing in ears, deaf, sign language,  vertigo, nosebleeds,  rhinitis,  cold sores, snoring, swollen  glands Cardiovascular: HTN, edema, arrhythmia, pacemaker in place, defibrillator in place, chest pain/tightness, chronic anticoagulation, blood clot, heart failure, MI Peripheral Vascular: leg cramps, varicose veins, blood clots, lymphedema, varicosities Respiratory:  asthma, difficulty breathing, denies congestion, SOB, wheezing, cough, emphysema Gastrointestinal: change in appetite or weight, abdominal pain, constipation, diarrhea, nausea, vomiting, vomiting blood, change in bowel habits, abdominal pain, jaundice, rectal bleeding, hemorrhoids, GERD Genitourinary:  nocturia,  pain on urination, polyuria,  blood in urine, Foley catheter, urinary urgency, ESRD on hemodialysis Musculoskeletal: amputation, cramping, stiff joints, painful joints, decreased joint motion, fractures, OA, gout, hemiplegia, paraplegia, uses cane, wheelchair bound, uses walker, uses rollator Skin: +changes in toenails, color change, dryness, itching, mole changes,  rash, wound(s) Neurological: headaches, numbness in feet, paresthesias in feet, burning in feet, fainting,  seizures, change in speech, migraines, memory problems/poor historian, cerebral palsy, weakness, paralysis, CVA, TIA Endocrine: diabetes, hypothyroidism, hyperthyroidism,  goiter, dry mouth, flushing, heat intolerance, cold intolerance,  excessive thirst, denies polyuria,  nocturia Hematological:  easy bleeding, excessive bleeding, easy bruising, enlarged lymph nodes, on long term blood thinner, history of past transusions Allergy/immunological:  hives, eczema, frequent infections, multiple drug allergies, seasonal allergies, transplant recipient, multiple food allergies Psychiatric:  anxiety, depression, mood disorder, suicidal ideations, hallucinations, insomnia  Objective: There were no vitals filed for this visit. Vascular Examination: Capillary refill time less than 3 seconds x 10 digits.  Dorsalis pedis pulses palpable 2 out of 4.  Posterior tibial  pulses palpable 2 out of 4.  Digital hair not present x 10 digits.  Skin temperature gradient WNL b/l.  Dermatological Examination: Skin with normal turgor, texture and tone b/l  Toenails 1-5 b/l discolored, thick, dystrophic with subungual debris and pain with palpation to nailbeds due to thickness of nails.  Musculoskeletal: Muscle strength 5/5 to all LE muscle groups.  Neurological: Sensation intact with 10 gram monofilament.  Vibratory sensation intact.  Assessment: 1. NIDDM 2. Encounter for diabetic foot examination  Plan: 1. Discussed diabetic foot care principles. Literature dispensed on today. 2. Patient to continue soft, supportive shoe gear daily. 3. Patient to report any pedal injuries to medical professional immediately. 4. Follow up one year. 5. Patient/POA to call should there be a concern in the interim. 6. Prescription for diabetic shoes was dispensed.

## 2020-10-28 ENCOUNTER — Ambulatory Visit: Payer: Medicaid Other | Admitting: Podiatry

## 2020-12-01 ENCOUNTER — Other Ambulatory Visit: Payer: Self-pay | Admitting: Nurse Practitioner

## 2020-12-01 DIAGNOSIS — K760 Fatty (change of) liver, not elsewhere classified: Secondary | ICD-10-CM | POA: Insufficient documentation

## 2020-12-01 DIAGNOSIS — K7402 Hepatic fibrosis, advanced fibrosis: Secondary | ICD-10-CM | POA: Insufficient documentation

## 2020-12-09 ENCOUNTER — Ambulatory Visit
Admission: RE | Admit: 2020-12-09 | Discharge: 2020-12-09 | Disposition: A | Discharge status: Home / Self Care | Payer: Medicaid Other | Source: Ambulatory Visit | Attending: Nurse Practitioner | Admitting: Nurse Practitioner

## 2020-12-09 DIAGNOSIS — K7402 Hepatic fibrosis, advanced fibrosis: Secondary | ICD-10-CM

## 2020-12-09 DIAGNOSIS — K760 Fatty (change of) liver, not elsewhere classified: Secondary | ICD-10-CM

## 2021-06-08 ENCOUNTER — Other Ambulatory Visit: Payer: Self-pay | Admitting: Nurse Practitioner

## 2021-06-08 DIAGNOSIS — K7402 Hepatic fibrosis, advanced fibrosis: Secondary | ICD-10-CM

## 2021-06-08 DIAGNOSIS — K760 Fatty (change of) liver, not elsewhere classified: Secondary | ICD-10-CM

## 2021-06-19 ENCOUNTER — Ambulatory Visit
Admission: RE | Admit: 2021-06-19 | Discharge: 2021-06-19 | Disposition: A | Discharge status: Home / Self Care | Payer: Medicaid Other | Source: Ambulatory Visit | Attending: Nurse Practitioner | Admitting: Nurse Practitioner

## 2021-06-19 DIAGNOSIS — K760 Fatty (change of) liver, not elsewhere classified: Secondary | ICD-10-CM

## 2021-06-19 DIAGNOSIS — K7402 Hepatic fibrosis, advanced fibrosis: Secondary | ICD-10-CM

## 2021-12-08 ENCOUNTER — Other Ambulatory Visit: Payer: Self-pay | Admitting: Nurse Practitioner

## 2021-12-08 DIAGNOSIS — K760 Fatty (change of) liver, not elsewhere classified: Secondary | ICD-10-CM

## 2021-12-08 DIAGNOSIS — K7402 Hepatic fibrosis, advanced fibrosis: Secondary | ICD-10-CM

## 2021-12-12 ENCOUNTER — Other Ambulatory Visit: Payer: Medicaid Other

## 2021-12-18 ENCOUNTER — Ambulatory Visit
Admission: RE | Admit: 2021-12-18 | Discharge: 2021-12-18 | Disposition: A | Discharge status: Home / Self Care | Payer: Medicaid Other | Source: Ambulatory Visit | Attending: Nurse Practitioner | Admitting: Nurse Practitioner

## 2021-12-18 DIAGNOSIS — K7402 Hepatic fibrosis, advanced fibrosis: Secondary | ICD-10-CM

## 2021-12-18 DIAGNOSIS — K760 Fatty (change of) liver, not elsewhere classified: Secondary | ICD-10-CM

## 2022-01-08 ENCOUNTER — Ambulatory Visit (INDEPENDENT_AMBULATORY_CARE_PROVIDER_SITE_OTHER): Payer: Medicaid Other

## 2022-01-08 ENCOUNTER — Ambulatory Visit (HOSPITAL_COMMUNITY)
Admission: EM | Admit: 2022-01-08 | Discharge: 2022-01-08 | Disposition: A | Discharge status: Home / Self Care | Payer: Medicaid Other | Attending: Physician Assistant | Admitting: Physician Assistant

## 2022-01-08 DIAGNOSIS — J40 Bronchitis, not specified as acute or chronic: Secondary | ICD-10-CM

## 2022-01-08 DIAGNOSIS — R059 Cough, unspecified: Secondary | ICD-10-CM | POA: Diagnosis not present

## 2022-01-08 DIAGNOSIS — J329 Chronic sinusitis, unspecified: Secondary | ICD-10-CM

## 2022-01-08 DIAGNOSIS — R051 Acute cough: Secondary | ICD-10-CM

## 2022-01-08 DIAGNOSIS — R0789 Other chest pain: Secondary | ICD-10-CM | POA: Diagnosis not present

## 2022-01-08 DIAGNOSIS — M940 Chondrocostal junction syndrome [Tietze]: Secondary | ICD-10-CM

## 2022-01-08 MED ORDER — PROMETHAZINE-DM 6.25-15 MG/5ML PO SYRP: 5.0000 mL | ORAL_SOLUTION | Freq: Every evening | ORAL | 0 refills | Status: DC | PRN

## 2022-01-08 MED ORDER — PREDNISONE 20 MG PO TABS: 40.0000 mg | ORAL_TABLET | Freq: Every day | ORAL | 0 refills | Status: DC

## 2022-01-08 MED ORDER — ALBUTEROL SULFATE HFA 108 (90 BASE) MCG/ACT IN AERS: 1.0000 | INHALATION_SPRAY | Freq: Four times a day (QID) | RESPIRATORY_TRACT | 0 refills | Status: AC | PRN

## 2022-01-08 MED ORDER — AMOXICILLIN-POT CLAVULANATE 875-125 MG PO TABS: 1.0000 | ORAL_TABLET | Freq: Two times a day (BID) | ORAL | 0 refills | Status: DC

## 2022-01-08 NOTE — Discharge Instructions (Addendum)
Start Augmentin to cover for infection.  Use albuterol every 4-6 hours for coughing fits and shortness of breath.  Use Promethazine DM for cough.  This will make you sleepy so do not drive or drink alcohol with taking it.  Start prednisone 40 mg for 5 days.  Do not take NSAIDs with this medication including aspirin, ibuprofen/Advil, naproxen/Aleve.  You can use Tylenol, Mucinex, Flonase for symptom relief.  Follow-up with your primary care if you are not feeling better soon.  If anything worsens you need to go to the emergency room.

## 2022-01-08 NOTE — ED Provider Notes (Signed)
[place]    CSN: [id number] Arrival date & time: 01/08/22  0802      History   Chief Complaint Chief Complaint  Patient presents with   Chest Pain    HPI Ronnie Burton is a 61 y.o. male.   Patient presents today with a 7 to 10-day history of URI symptoms.  Reports cough, chest pain, shortness of breath, congestion.  Denies any fever, nausea, vomiting, diarrhea.  He has been taking NyQuil without improvement of symptoms.  Reports household sick contacts as his wife was sick before his symptoms began.  Denies any recent antibiotic or steroid use.  He denies history of asthma, allergies, COPD.  He does smoke.  He does report substernal chest pain which is currently rated 7 on a 0-10 pain scale, described as aching, no aggravating alleviating factors identified.  He denies any association with physical activity.  States it feels that his lungs are a problem.  He is having difficulty with daily activities including sleeping at night due to severity of symptoms.    Past Medical History:  Diagnosis Date   Bipolar 1 disorder ([place])    Headache    Heart murmur    Hypertension    Schizophrenia [facility])     Patient Active Problem List   Diagnosis Date Noted   Callus 07/29/2020   Chest pain 02/23/2015   Cigarette smoker 02/23/2015   Pain in the chest    Intravenous drug abuse ([place])    Opioid use disorder, severe, dependence ([place]) 12/07/2014   Hepatitis C 12/07/2014   Schizoaffective disorder, bipolar type ([place]) 12/07/2014    No past surgical history on file.     Home Medications    Prior to Admission medications   Medication Sig Start Date End Date Taking? Authorizing Provider  amoxicillin-clavulanate (AUGMENTIN) 875-125 MG tablet Take 1 tablet by mouth every 12 (twelve) hours. 01/08/22  Yes Blyss Lugar K, PA-C  promethazine-dextromethorphan (PROMETHAZINE-DM) 6.25-15 MG/5ML syrup Take 5 mLs by mouth at bedtime as needed for cough. 01/08/22  Yes Danni Leabo K,  PA-C  albuterol (VENTOLIN HFA) 108 (90 Base) MCG/ACT inhaler Inhale 1-2 puffs into the lungs every 6 (six) hours as needed for wheezing or shortness of breath. 01/08/22   Seerat Peaden, Derry Skill, PA-C  ibuprofen (ADVIL,MOTRIN) 600 MG tablet Take 1 tablet (600 mg total) by mouth every 6 (six) hours as needed. 08/02/16   Domenic Moras, PA-C  predniSONE (DELTASONE) 20 MG tablet Take 2 tablets (40 mg total) by mouth daily with breakfast. 01/08/22   Telma Pyeatt, Junie Panning K, PA-C  QUEtiapine (SEROQUEL) 50 MG tablet Take 1 tablet (50 mg) twice daily & 3 tablets (150 mg) at bedtime: For mood control 12/13/14   Lindell Spar I, NP  traZODone (DESYREL) 150 MG tablet Take 1 tablet (150 mg total) by mouth at bedtime. For sleep 12/13/14   Lindell Spar I, NP    Family History Family History  Problem Relation Age of Onset   Alcoholism Father     Social History Social History   Tobacco Use   Smoking status: Every Day    Packs/day: 0.25    Years: 20.00    Total pack years: 5.00    Types: Cigarettes   Smokeless tobacco: Never  Substance Use Topics   Alcohol use: Yes    Comment: occ   Drug use: No     Allergies   Patient has no known allergies.   Review of Systems Review of Systems  Constitutional:  Positive  for activity change. Negative for appetite change, fatigue and fever.  HENT:  Positive for congestion. Negative for sinus pressure, sneezing and sore throat.   Respiratory:  Positive for cough, chest tightness and shortness of breath.   Cardiovascular:  Positive for chest pain.  Gastrointestinal:  Negative for abdominal pain, diarrhea, nausea and vomiting.  Neurological:  Negative for dizziness, light-headedness and headaches.     Physical Exam Triage Vital Signs ED Triage Vitals  Enc Vitals Group     BP 01/08/22 0814 136/83     Pulse Rate 01/08/22 0814 65     Resp 01/08/22 0814 12     Temp 01/08/22 0814 98.3 F (36.8 C)     Temp Source 01/08/22 0814 Oral     SpO2 01/08/22 0814 96 %     Weight  01/08/22 0809 240 lb (108.9 kg)     Height 01/08/22 0809 '6\' 5"'$  (1.956 m)     Head Circumference --      Peak Flow --      Pain Score 01/08/22 0809 8     Pain Loc --      Pain Edu? --      Excl. in [place]? --    No data found.  Updated Vital Signs BP 136/83 (BP Location: Left Arm)   Pulse 65   Temp 98.3 F (36.8 C) (Oral)   Resp 12   Ht '6\' 5"'$  (1.956 m)   Wt 240 lb (108.9 kg)   SpO2 96%   BMI 28.46 kg/m   Visual Acuity Right Eye Distance:   Left Eye Distance:   Bilateral Distance:    Right Eye Near:   Left Eye Near:    Bilateral Near:     Physical Exam Vitals reviewed.  Constitutional:      General: He is awake.     Appearance: Normal appearance. He is well-developed. He is not ill-appearing.     Comments: Very pleasant male appears stated age in no acute distress sitting comfortably in exam room  HENT:     Head: Normocephalic and atraumatic.     Right Ear: Tympanic membrane, ear canal and external ear normal. Tympanic membrane is not erythematous or bulging.     Left Ear: Tympanic membrane, ear canal and external ear normal. Tympanic membrane is not erythematous or bulging.     Nose: Nose normal.     Mouth/Throat:     Pharynx: Uvula midline. No oropharyngeal exudate or posterior oropharyngeal erythema.  Cardiovascular:     Rate and Rhythm: Normal rate and regular rhythm.     Heart sounds: Normal heart sounds, S1 normal and S2 normal. No murmur heard. Pulmonary:     Effort: Pulmonary effort is normal. No accessory muscle usage or respiratory distress.     Breath sounds: No stridor. Examination of the right-lower field reveals decreased breath sounds. Examination of the left-lower field reveals decreased breath sounds. Decreased breath sounds present. No wheezing, rhonchi or rales.     Comments: Reactive cough with deep breathing Chest:     Chest wall: Tenderness present. No deformity or swelling.     Comments: Pain reproducible on exam. Abdominal:     General: Bowel  sounds are normal.     Palpations: Abdomen is soft.     Tenderness: There is no abdominal tenderness.  Neurological:     Mental Status: He is alert.  Psychiatric:        Behavior: Behavior is cooperative.      UC Treatments /  Results  Labs (all labs ordered are listed, but only abnormal results are displayed) Labs Reviewed - No data to display  EKG   Radiology DG Chest 2 View  Result Date: 01/08/2022 CLINICAL DATA:  Severe cough and chest pain for 1 week with congestion. EXAM: CHEST - 2 VIEW COMPARISON:  February 23, 2015 FINDINGS: The heart size and mediastinal contours are within normal limits. Both lungs are clear. The visualized skeletal structures are unremarkable. IMPRESSION: No active cardiopulmonary disease. Electronically Signed   By: Zetta Bills M.D.   On: 01/08/2022 08:51    Procedures Procedures (including critical care time)  Medications Ordered in UC Medications - No data to display  Initial Impression / Assessment and Plan / UC Course  I have reviewed the triage vital signs and the nursing notes.  Pertinent labs & imaging results that were available during my care of the patient were reviewed by me and considered in my medical decision making (see chart for details).     Patient is well-appearing, afebrile, nontoxic, nontachycardic.  Chest x-ray was obtained that showed no acute cardiopulmonary disease.  Pain is reproducible on exam and consistent with costochondritis.  No indication for viral testing as patient has been symptomatic for over a week and this would not change management.  Given prolonged and worsening symptoms concern for secondary bacterial infection.  We will start Augmentin twice daily for 7 days.  Patient was started on prednisone burst with instruction not to take NSAIDs with this medication.  He was prescribed Promethazine DM for cough with instruction not to drive or drink alcohol with taking this.  Can use albuterol every 4-6 hours as  needed for shortness of breath and coughing fits.  He is to rest and drink plenty of fluid.  Can use over-the-counter medication including Tylenol, Mucinex, Flonase for additional symptom relief.  Recommend that he follow-up with his primary care if his symptoms have not significantly improved within 3 to 5 days.  Discussed that if he has any worsening symptoms including shortness of breath, worsening cough, chest pain, fever, nausea, vomiting, weakness he needs to be seen immediately.  Final Clinical Impressions(s) / UC Diagnoses   Final diagnoses:  Sinobronchitis  Acute cough  Costochondritis     Discharge Instructions      Start Augmentin to cover for infection.  Use albuterol every 4-6 hours for coughing fits and shortness of breath.  Use Promethazine DM for cough.  This will make you sleepy so do not drive or drink alcohol with taking it.  Start prednisone 40 mg for 5 days.  Do not take NSAIDs with this medication including aspirin, ibuprofen/Advil, naproxen/Aleve.  You can use Tylenol, Mucinex, Flonase for symptom relief.  Follow-up with your primary care if you are not feeling better soon.  If anything worsens you need to go to the emergency room.     ED Prescriptions     Medication Sig Dispense Auth. Provider   albuterol (VENTOLIN HFA) 108 (90 Base) MCG/ACT inhaler Inhale 1-2 puffs into the lungs every 6 (six) hours as needed for wheezing or shortness of breath. 18 g Joron Velis K, PA-C   predniSONE (DELTASONE) 20 MG tablet Take 2 tablets (40 mg total) by mouth daily with breakfast. 10 tablet Osinachi Navarrette K, PA-C   promethazine-dextromethorphan (PROMETHAZINE-DM) 6.25-15 MG/5ML syrup Take 5 mLs by mouth at bedtime as needed for cough. 118 mL Akosua Constantine K, PA-C   amoxicillin-clavulanate (AUGMENTIN) 875-125 MG tablet Take 1 tablet by mouth every  12 (twelve) hours. 14 tablet Cayce Paschal, Derry Skill, PA-C      PDMP not reviewed this encounter.   Terrilee Croak, PA-C 01/08/22 4720

## 2022-01-08 NOTE — ED Triage Notes (Signed)
Pt complains of chest pain x1wk and chest congestion light cough as well

## 2022-06-11 ENCOUNTER — Other Ambulatory Visit: Payer: Self-pay | Admitting: Nurse Practitioner

## 2022-06-11 DIAGNOSIS — K760 Fatty (change of) liver, not elsewhere classified: Secondary | ICD-10-CM

## 2022-06-11 DIAGNOSIS — Z789 Other specified health status: Secondary | ICD-10-CM | POA: Insufficient documentation

## 2022-07-10 ENCOUNTER — Ambulatory Visit
Admission: RE | Admit: 2022-07-10 | Discharge: 2022-07-10 | Disposition: A | Discharge status: Home / Self Care | Payer: Medicaid Other | Source: Ambulatory Visit | Attending: Nurse Practitioner | Admitting: Nurse Practitioner

## 2022-07-10 DIAGNOSIS — K760 Fatty (change of) liver, not elsewhere classified: Secondary | ICD-10-CM

## 2022-09-17 ENCOUNTER — Encounter (HOSPITAL_COMMUNITY): Payer: Self-pay

## 2022-09-17 ENCOUNTER — Emergency Department (HOSPITAL_COMMUNITY): Payer: Medicaid Other

## 2022-09-17 ENCOUNTER — Emergency Department (HOSPITAL_COMMUNITY)
Admission: EM | Admit: 2022-09-17 | Discharge: 2022-09-17 | Disposition: A | Discharge status: Home / Self Care | Payer: Medicaid Other | Attending: Emergency Medicine | Admitting: Emergency Medicine

## 2022-09-17 ENCOUNTER — Other Ambulatory Visit: Payer: Self-pay

## 2022-09-17 ENCOUNTER — Ambulatory Visit (HOSPITAL_COMMUNITY)
Admission: EM | Admit: 2022-09-17 | Discharge: 2022-09-17 | Disposition: A | Discharge status: Home / Self Care | Payer: Medicaid Other | Attending: Family Medicine | Admitting: Family Medicine

## 2022-09-17 DIAGNOSIS — R1032 Left lower quadrant pain: Secondary | ICD-10-CM | POA: Insufficient documentation

## 2022-09-17 DIAGNOSIS — R109 Unspecified abdominal pain: Secondary | ICD-10-CM | POA: Diagnosis present

## 2022-09-17 DIAGNOSIS — R1012 Left upper quadrant pain: Secondary | ICD-10-CM | POA: Insufficient documentation

## 2022-09-17 DIAGNOSIS — R1010 Upper abdominal pain, unspecified: Secondary | ICD-10-CM

## 2022-09-17 LAB — CBC
HCT: 42.2 % (ref 39.0–52.0)
Hemoglobin: 14.3 g/dL (ref 13.0–17.0)
MCH: 31.3 pg (ref 26.0–34.0)
MCHC: 33.9 g/dL (ref 30.0–36.0)
MCV: 92.3 fL (ref 80.0–100.0)
Platelets: 139 10*3/uL — ABNORMAL LOW (ref 150–400)
RBC: 4.57 MIL/uL (ref 4.22–5.81)
RDW: 11.9 % (ref 11.5–15.5)
WBC: 9.2 10*3/uL (ref 4.0–10.5)
nRBC: 0 % (ref 0.0–0.2)

## 2022-09-17 LAB — POCT URINALYSIS DIP (MANUAL ENTRY)
Bilirubin, UA: NEGATIVE
Blood, UA: NEGATIVE
Glucose, UA: NEGATIVE mg/dL
Ketones, POC UA: NEGATIVE mg/dL
Nitrite, UA: NEGATIVE
Protein Ur, POC: NEGATIVE mg/dL
Spec Grav, UA: 1.015 (ref 1.010–1.025)
Urobilinogen, UA: 0.2 E.U./dL
pH, UA: 5.5 (ref 5.0–8.0)

## 2022-09-17 LAB — COMPREHENSIVE METABOLIC PANEL
ALT: 55 U/L — ABNORMAL HIGH (ref 0–44)
AST: 49 U/L — ABNORMAL HIGH (ref 15–41)
Albumin: 3.8 g/dL (ref 3.5–5.0)
Alkaline Phosphatase: 73 U/L (ref 38–126)
Anion gap: 11 (ref 5–15)
BUN: 14 mg/dL (ref 8–23)
CO2: 19 mmol/L — ABNORMAL LOW (ref 22–32)
Calcium: 8.9 mg/dL (ref 8.9–10.3)
Chloride: 106 mmol/L (ref 98–111)
Creatinine, Ser: 1.17 mg/dL (ref 0.61–1.24)
GFR, Estimated: >60 mL/min (ref >60)
Glucose, Bld: 113 mg/dL — ABNORMAL HIGH (ref 70–99)
Potassium: 4.2 mmol/L (ref 3.5–5.1)
Sodium: 136 mmol/L (ref 135–145)
Total Bilirubin: 0.8 mg/dL (ref 0.3–1.2)
Total Protein: 7.8 g/dL (ref 6.5–8.1)

## 2022-09-17 LAB — URINALYSIS, ROUTINE W REFLEX MICROSCOPIC
Bacteria, UA: NONE SEEN
Bilirubin Urine: NEGATIVE
Glucose, UA: NEGATIVE mg/dL
Ketones, ur: NEGATIVE mg/dL
Leukocytes,Ua: NEGATIVE
Nitrite: NEGATIVE
Protein, ur: NEGATIVE mg/dL
Specific Gravity, Urine: 1.008 (ref 1.005–1.030)
pH: 5 (ref 5.0–8.0)

## 2022-09-17 LAB — LIPASE, BLOOD: Lipase: 44 U/L (ref 11–51)

## 2022-09-17 MED ORDER — IOHEXOL 350 MG/ML SOLN
75.0000 mL | Freq: Once | INTRAVENOUS | Status: AC | PRN
  Administered 2022-09-17: 75 mL via INTRAVENOUS

## 2022-09-17 MED ORDER — SODIUM CHLORIDE 0.9 % IV BOLUS
1000.0000 mL | Freq: Once | INTRAVENOUS | Status: AC
  Administered 2022-09-17: 1000 mL via INTRAVENOUS

## 2022-09-17 MED ORDER — KETOROLAC TROMETHAMINE 15 MG/ML IJ SOLN
15.0000 mg | Freq: Once | INTRAMUSCULAR | Status: AC
  Administered 2022-09-17: 15 mg via INTRAVENOUS
  Filled 2022-09-17: qty 1

## 2022-09-17 MED ORDER — HYDROMORPHONE HCL 1 MG/ML IJ SOLN
0.5000 mg | Freq: Once | INTRAMUSCULAR | Status: AC
  Administered 2022-09-17: 0.5 mg via INTRAVENOUS
  Filled 2022-09-17: qty 1

## 2022-09-17 NOTE — ED Provider Notes (Signed)
[place] EMERGENCY DEPARTMENT AT [facility] Provider Note   CSN: [id number] Arrival date & time: 09/17/22  1121     History Chief Complaint  Patient presents with   Abdominal Pain   Flank Pain    Ronnie Burton is a 62 y.o. male.  Patient with past medical history significant for hepatitis C, advanced hepatic fibrosis, steatosis, and gallstones presents emergency department complaints of right flank and left-sided abdominal pain.  Reports that this pain is been present for the last 2 days.  Also reported some associated pain with urination.  Reports the pain is currently an 8 out of 10 and requesting pain medication.  Denies any nausea, vomiting, diarrhea, fever.  Does endorse some burning with urination but denies any gross hematuria.  He states that the sensation is a feeling of incomplete voiding.  States that appetite is still at baseline and has been eating and drinking without difficulty.   Abdominal Pain Flank Pain Associated symptoms include abdominal pain.       Home Medications Prior to Admission medications   Medication Sig Start Date End Date Taking? Authorizing Provider  albuterol (VENTOLIN HFA) 108 (90 Base) MCG/ACT inhaler Inhale 1-2 puffs into the lungs every 6 (six) hours as needed for wheezing or shortness of breath. 01/08/22   Raspet, Noberto Retort, PA-C  amoxicillin-clavulanate (AUGMENTIN) 875-125 MG tablet Take 1 tablet by mouth every 12 (twelve) hours. 01/08/22   Raspet, Noberto Retort, PA-C  ibuprofen (ADVIL,MOTRIN) 600 MG tablet Take 1 tablet (600 mg total) by mouth every 6 (six) hours as needed. 08/02/16   Fayrene Helper, PA-C  predniSONE (DELTASONE) 20 MG tablet Take 2 tablets (40 mg total) by mouth daily with breakfast. 01/08/22   Raspet, Noberto Retort, PA-C  promethazine-dextromethorphan (PROMETHAZINE-DM) 6.25-15 MG/5ML syrup Take 5 mLs by mouth at bedtime as needed for cough. 01/08/22   Raspet, Noberto Retort, PA-C  QUEtiapine (SEROQUEL) 50 MG tablet Take 1 tablet (50 mg) twice  daily & 3 tablets (150 mg) at bedtime: For mood control 12/13/14   Armandina Stammer I, NP  traZODone (DESYREL) 150 MG tablet Take 1 tablet (150 mg total) by mouth at bedtime. For sleep 12/13/14   Armandina Stammer I, NP      Allergies    Patient has no known allergies.    Review of Systems   Review of Systems  Gastrointestinal:  Positive for abdominal pain.  Genitourinary:  Positive for flank pain.  All other systems reviewed and are negative.   Physical Exam Updated Vital Signs BP (!) 163/83 (BP Location: Left Arm)   Pulse 69   Temp 98 F (36.7 C) (Oral)   Resp 12   Ht 6\' 5"  (1.956 m)   Wt 108.9 kg   SpO2 100%   BMI 28.46 kg/m  Physical Exam Vitals and nursing note reviewed.  Constitutional:      General: He is not in acute distress.    Appearance: He is well-developed.  HENT:     Head: Normocephalic and atraumatic.  Eyes:     Conjunctiva/sclera: Conjunctivae normal.  Cardiovascular:     Rate and Rhythm: Normal rate and regular rhythm.     Heart sounds: No murmur heard. Pulmonary:     Effort: Pulmonary effort is normal. No respiratory distress.     Breath sounds: Normal breath sounds.  Abdominal:     General: Abdomen is flat. Bowel sounds are normal.     Palpations: Abdomen is soft.     Tenderness: There is  abdominal tenderness in the left upper quadrant and left lower quadrant. There is right CVA tenderness.  Musculoskeletal:        General: No swelling.     Cervical back: Neck supple.  Skin:    General: Skin is warm and dry.     Capillary Refill: Capillary refill takes less than 2 seconds.  Neurological:     Mental Status: He is alert.  Psychiatric:        Mood and Affect: Mood normal.     ED Results / Procedures / Treatments   Labs (all labs ordered are listed, but only abnormal results are displayed) Labs Reviewed  COMPREHENSIVE METABOLIC PANEL - Abnormal; Notable for the following components:      Result Value   CO2 19 (*)    Glucose, Bld 113 (*)    AST 49  (*)    ALT 55 (*)    All other components within normal limits  CBC - Abnormal; Notable for the following components:   Platelets 139 (*)    All other components within normal limits  URINALYSIS, ROUTINE W REFLEX MICROSCOPIC - Abnormal; Notable for the following components:   Hgb urine dipstick SMALL (*)    All other components within normal limits  LIPASE, BLOOD    EKG EKG Interpretation  Date/Time:  Monday Sep 17 2022 11:22:48 EDT Ventricular Rate:  69 PR Interval:  182 QRS Duration: 90 QT Interval:  398 QTC Calculation: 426 R Axis:   72 Text Interpretation: Normal sinus rhythm Nonspecific T wave abnormality Abnormal ECG When compared with ECG of 23-Feb-2015 06:50, Nonspecific T wave abnormality now evident in lateral leads Confirmed by Alvino Blood ([id number]) on 09/17/2022 2:19:06 PM  Radiology CT ABDOMEN PELVIS W CONTRAST  Result Date: 09/17/2022 CLINICAL DATA:  62 year old male with history of left lower quadrant abdominal and flank pain. Suspected kidney stone. EXAM: CT ABDOMEN AND PELVIS WITH CONTRAST TECHNIQUE: Multidetector CT imaging of the abdomen and pelvis was performed using the standard protocol following bolus administration of intravenous contrast. RADIATION DOSE REDUCTION: This exam was performed according to the departmental dose-optimization program which includes automated exposure control, adjustment of the mA and/or kV according to patient size and/or use of iterative reconstruction technique. CONTRAST:  75mL OMNIPAQUE IOHEXOL 350 MG/ML SOLN COMPARISON:  No priors. FINDINGS: Lower chest: Unremarkable. Hepatobiliary: No suspicious cystic or solid hepatic lesions. No intra or extrahepatic biliary ductal dilatation. Partially calcified gallstones lie dependently in the gallbladder. No findings to suggest an acute cholecystitis are noted at this time. Pancreas: No pancreatic mass. No pancreatic ductal dilatation. No pancreatic or peripancreatic fluid collections or  inflammatory changes. Spleen: Unremarkable. Adrenals/Urinary Tract: Bilateral kidneys and bilateral adrenal glands are normal in appearance. No hydroureteronephrosis. Urinary bladder is unremarkable in appearance. Stomach/Bowel: The appearance of the stomach is normal. No pathologic dilatation of small bowel or colon. Partially calcified lesions associated with the sigmoid colon measuring up to 1.6 x 0.9 cm (axial image 75 of series 3), likely an area of fat necrosis from prior appendagitis epiploicae (no surrounding inflammation noted at this time). Normal appendix. Vascular/Lymphatic: Atherosclerosis of the abdominal aorta and pelvic vasculature. No lymphadenopathy noted in the abdomen or pelvis. Reproductive: Prostate gland and seminal vesicles are unremarkable in appearance. Other: No significant volume of ascites.  No pneumoperitoneum. Musculoskeletal: There are no aggressive appearing lytic or blastic lesions noted in the visualized portions of the skeleton. IMPRESSION: 1. No acute findings are noted in the abdomen or pelvis to account for the  patient's symptoms. 2. Cholelithiasis without evidence of acute cholecystitis. 3. Additional incidental findings, as above. Electronically Signed   By: Trudie Reed M.D.   On: 09/17/2022 13:14    Procedures Procedures   Medications Ordered in ED Medications  sodium chloride 0.9 % bolus 1,000 mL (1,000 mLs Intravenous New Bag/Given 09/17/22 1236)  ketorolac (TORADOL) 15 MG/ML injection 15 mg (15 mg Intravenous Given 09/17/22 1232)  HYDROmorphone (DILAUDID) injection 0.5 mg (0.5 mg Intravenous Given 09/17/22 1232)  iohexol (OMNIPAQUE) 350 MG/ML injection 75 mL (75 mLs Intravenous Contrast Given 09/17/22 1255)    ED Course/ Medical Decision Making/ A&P                           Medical Decision Making Amount and/or Complexity of Data Reviewed Labs: ordered. Radiology: ordered.  Risk Prescription drug management.   This patient presents to the ED for  concern of abdominal pain, flank pain.  Differential diagnosis includes gastritis, nephrolithiasis, pyelonephritis, bowel obstruction, cholecystitis   Lab Tests:  I Ordered, and personally interpreted labs.  The pertinent results include: CBC unremarkable, CMP with transaminitis, urinalysis with evidence of blood but no signs of obvious infection, lipase normal   Imaging Studies ordered:  I ordered imaging studies including CT abdomen pelvis I independently visualized and interpreted imaging which showed no acute abdominal pathology to account for patient's symptoms. I agree with the radiologist interpretation   Medicines ordered and prescription drug management:  I ordered medication including fluids, Dilaudid, Toradol for pain Reevaluation of the patient after these medicines showed that the patient improved I have reviewed the patients home medicines and have made adjustments as needed   Problem List / ED Course:  Patient presents emergency department complaints of abdominal pain and flank pain.  Reports has been present for the last 2 days.  Endorsing associated difficulty with urination as a feeling of incomplete voiding as well as some burning.  Denies any obvious hematuria when urinating.  Patient had administration of fluids, Toradol, Dilaudid which has resolved his symptoms at this time.  Informed patient of findings on lab work and imaging of all being largely reassuring for any acute cause to account for patient's symptoms at this time.  Advised patient to trial removal of bladder irritant substances from diet such as caffeine, alcohol, tobacco.  Will provide patient with contact information for [facility] to have patient seek evaluation or follow-up if he is not having improvement in symptoms.  There is no acute indication for any antibiotic treatment at this time as patient denies any concerns for STIs and would not like testing for this.  UA does not show signs of an acute  infection either except for a small amount of blood.  Advised patient to take Tylenol, ibuprofen, Aleve as needed for pain.  At this time I believe the patient is stable and safe for discharge given improvement and resolution of symptoms as well as remaining hemodynamically stable.  Patient is agreeable to treatment plan verbalized understanding all return precautions.  All questions answered prior to patient discharge.  Final Clinical Impression(s) / ED Diagnoses Final diagnoses:  Flank pain  Left lower quadrant abdominal pain    Rx / DC Orders ED Discharge Orders     None         Smitty Knudsen, PA-C 09/17/22 1433    Lonell Grandchild, MD 09/17/22 1539

## 2022-09-17 NOTE — ED Triage Notes (Signed)
Pt presents with abdominal pain and lower back pain x 4 days. Pt reports a history of gallstones.  Denies any trauma or back pain.

## 2022-09-17 NOTE — ED Provider Notes (Signed)
[facility]    CSN: [id number] Arrival date & time: 09/17/22  0957      History   Chief Complaint Chief Complaint  Patient presents with   Back Pain   Abdominal Pain    HPI Koner Frerking is a 62 y.o. male.  Here with 2 day history of upper abdominal pain, right low back pain. Pain is 6/10 at rest, up to 8 or 9 with palpation  Abd pain mostly epigastric but radiates to bilat upper quadrants. No nausea, vomiting, diarrhea, fever Denies urinary symptoms  Able to eat and drink normally  History of hepC, advanced hepatic fibrosis, steatosis,  History of gallstones on ultrasound  Negative liver cancer screening in march  Past Medical History:  Diagnosis Date   Bipolar 1 disorder (HCC)    Headache    Heart murmur    Hypertension    Schizophrenia [facility])     Patient Active Problem List   Diagnosis Date Noted   Callus 07/29/2020   Chest pain 02/23/2015   Cigarette smoker 02/23/2015   Pain in the chest    Intravenous drug abuse (HCC)    Opioid use disorder, severe, dependence (HCC) 12/07/2014   Hepatitis C 12/07/2014   Schizoaffective disorder, bipolar type (HCC) 12/07/2014    History reviewed. No pertinent surgical history.     Home Medications    Prior to Admission medications   Medication Sig Start Date End Date Taking? Authorizing Provider  albuterol (VENTOLIN HFA) 108 (90 Base) MCG/ACT inhaler Inhale 1-2 puffs into the lungs every 6 (six) hours as needed for wheezing or shortness of breath. 01/08/22   Raspet, Noberto Retort, PA-C  amoxicillin-clavulanate (AUGMENTIN) 875-125 MG tablet Take 1 tablet by mouth every 12 (twelve) hours. 01/08/22   Raspet, Noberto Retort, PA-C  ibuprofen (ADVIL,MOTRIN) 600 MG tablet Take 1 tablet (600 mg total) by mouth every 6 (six) hours as needed. 08/02/16   Fayrene Helper, PA-C  predniSONE (DELTASONE) 20 MG tablet Take 2 tablets (40 mg total) by mouth daily with breakfast. 01/08/22   Raspet, Noberto Retort, PA-C  promethazine-dextromethorphan  (PROMETHAZINE-DM) 6.25-15 MG/5ML syrup Take 5 mLs by mouth at bedtime as needed for cough. 01/08/22   Raspet, Noberto Retort, PA-C  QUEtiapine (SEROQUEL) 50 MG tablet Take 1 tablet (50 mg) twice daily & 3 tablets (150 mg) at bedtime: For mood control 12/13/14   Armandina Stammer I, NP  traZODone (DESYREL) 150 MG tablet Take 1 tablet (150 mg total) by mouth at bedtime. For sleep 12/13/14   Armandina Stammer I, NP    Family History Family History  Problem Relation Age of Onset   Alcoholism Father     Social History Social History   Tobacco Use   Smoking status: Every Day    Packs/day: 0.25    Years: 20.00    Additional pack years: 0.00    Total pack years: 5.00    Types: Cigarettes   Smokeless tobacco: Never  Substance Use Topics   Alcohol use: Yes    Comment: occ   Drug use: No     Allergies   Patient has no known allergies.   Review of Systems Review of Systems  Gastrointestinal:  Positive for abdominal pain.  Musculoskeletal:  Positive for back pain.   Per HPI  Physical Exam Triage Vital Signs ED Triage Vitals  Enc Vitals Group     BP 09/17/22 1037 (!) 155/85     Pulse Rate 09/17/22 1037 78     Resp 09/17/22 1037  18     Temp 09/17/22 1037 98.5 F (36.9 C)     Temp Source 09/17/22 1037 Oral     SpO2 09/17/22 1037 98 %     Weight --      Height --      Head Circumference --      Peak Flow --      Pain Score 09/17/22 1040 6     Pain Loc --      Pain Edu? --      Excl. in GC? --    No data found.  Updated Vital Signs BP (!) 155/85 (BP Location: Left Arm)   Pulse 78   Temp 98.5 F (36.9 C) (Oral)   Resp 18   SpO2 98%    Physical Exam Vitals and nursing note reviewed.  Constitutional:      General: He is not in acute distress.    Appearance: Normal appearance. He is not ill-appearing or diaphoretic.  HENT:     Mouth/Throat:     Mouth: Mucous membranes are moist.     Pharynx: Oropharynx is clear.  Eyes:     General: No scleral icterus.    Conjunctiva/sclera:  Conjunctivae normal.  Cardiovascular:     Rate and Rhythm: Normal rate and regular rhythm.     Heart sounds: Normal heart sounds.  Pulmonary:     Effort: Pulmonary effort is normal. No respiratory distress.     Breath sounds: Normal breath sounds.  Abdominal:     General: Abdomen is protuberant. Bowel sounds are normal.     Palpations: Abdomen is soft.     Tenderness: There is abdominal tenderness in the right upper quadrant, epigastric area and left upper quadrant. There is guarding (upper abd). There is no right CVA tenderness, left CVA tenderness or rebound.  Musculoskeletal:        General: Normal range of motion.  Skin:    General: Skin is warm and dry.     Findings: No rash.  Neurological:     Mental Status: He is alert and oriented to person, place, and time.     UC Treatments / Results  Labs (all labs ordered are listed, but only abnormal results are displayed) Labs Reviewed  POCT URINALYSIS DIP (MANUAL ENTRY) - Abnormal; Notable for the following components:      Result Value   Leukocytes, UA Trace (*)    All other components within normal limits    EKG   Radiology No results found.  Procedures Procedures (including critical care time)  Medications Ordered in UC Medications - No data to display  Initial Impression / Assessment and Plan / UC Course  I have reviewed the triage vital signs and the nursing notes.  Pertinent labs & imaging results that were available during my care of the patient were reviewed by me and considered in my medical decision making (see chart for details).  Afebrile, stable vitals, overall well appearing  UA with trace leuks He is tender across the upper abdomen with guarding R and L upper quadrants. With his history and exam he likely needs advanced imaging of the belly. Sent to ED for scan, higher level of care  Final Clinical Impressions(s) / UC Diagnoses   Final diagnoses:  Pain of upper abdomen     Discharge Instructions       Sent to ED via POV     ED Prescriptions   None    PDMP not reviewed this encounter.   Wenda Vanschaick,  Lurena Joiner, PA-C 09/17/22 1112

## 2022-09-17 NOTE — ED Triage Notes (Signed)
Reports right flank pain and abd pain all over.  Reports started 3-4days ago.  Also reports difficulty urinating.

## 2022-09-17 NOTE — Discharge Instructions (Addendum)
Sent to ED via POV ?

## 2022-09-17 NOTE — Discharge Instructions (Signed)
You were seen in the emergency department today for abdominal pain and flank pain.  Thankfully your labs and imaging were all reassuring without any acute cause to account for your symptoms at this time.  He had improvement with IV pain medications here in the emergency department and I would advise you take Tylenol, ibuprofen, or Aleve as needed for pain relief at home.  If you have any acute worsening of your symptoms, please return to the emergency department.  Otherwise, please follow-up with your primary care provider.

## 2022-11-16 NOTE — Progress Notes (Signed)
Sent message, via epic in basket, requesting orders in epic from surgeon.  

## 2022-11-20 ENCOUNTER — Ambulatory Visit: Payer: Self-pay | Admitting: Surgery

## 2022-11-22 NOTE — Progress Notes (Addendum)
COVID Vaccine Completed: yes  Date of COVID positive in last 90 days: no  PCP - Carlena Bjornstad, MD Cardiologist - n/a  Chest x-ray - 06/23/22 [facility] EKG - 09/18/22 Epic  Stress Test - n/a ECHO - n/a Cardiac Cath - n/a Pacemaker/ICD device last checked: n/a Spinal Cord Stimulator: n/a  Bowel Prep - no  Sleep Study - n/a CPAP -   Fasting Blood Sugar - n/a Checks Blood Sugar _____ times a day  Last dose of GLP1 agonist-  N/A GLP1 instructions:  N/A   Last dose of SGLT-2 inhibitors-  N/A SGLT-2 instructions: N/A   Blood Thinner Instructions:  n/a Aspirin Instructions: Last Dose:  Activity level: Can go up a flight of stairs and perform activities of daily living without stopping and without symptoms of chest pain or shortness of breath.  Anesthesia review: heart murmur, mild MI in the 80s per pt in [place]. Patient states he can walk to his mailbox and back with no cardiac symptoms. Denies any heart issues since the 80s. HTN,  hep c  Patient denies shortness of breath, fever, cough and chest pain at PAT appointment  Patient verbalized understanding of instructions that were given to them at the PAT appointment. Patient was also instructed that they will need to review over the PAT instructions again at home before surgery.

## 2022-11-22 NOTE — Patient Instructions (Addendum)
SURGICAL WAITING ROOM VISITATION  Patients having surgery or a procedure may have no more than 2 support people in the waiting area - these visitors may rotate.    Children under the age of 69 must have an adult with them who is not the patient.  Due to an increase in RSV and influenza rates and associated hospitalizations, children ages 4 and under may not visit patients in [facility] hospitals.  If the patient needs to stay at the hospital during part of their recovery, the visitor guidelines for inpatient rooms apply. Pre-op nurse will coordinate an appropriate time for 1 support person to accompany patient in pre-op.  This support person may not rotate.    Please refer to the [facility] website for the visitor guidelines for Inpatients (after your surgery is over and you are in a regular room).    Your procedure is scheduled on: 12/05/22   Report to [facility] Main Entrance    Report to admitting at 6:15 AM   Call this number if you have problems the morning of surgery [phone]   Do not eat food :After Midnight.   After Midnight you may have the following liquids until 5:30 AM DAY OF SURGERY  Water Non-Citrus Juices (without pulp, NO RED-Apple, White grape, White cranberry) Black Coffee (NO MILK/CREAM OR CREAMERS, sugar ok)  Clear Tea (NO MILK/CREAM OR CREAMERS, sugar ok) regular and decaf                             Plain Jell-O (NO RED)                                           Fruit ices (not with fruit pulp, NO RED)                                     Popsicles (NO RED)                                                               Sports drinks like Gatorade (NO RED)           If you have questions, please contact your surgeon's office.   FOLLOW BOWEL PREP AND ANY ADDITIONAL PRE OP INSTRUCTIONS YOU RECEIVED FROM YOUR SURGEON'S OFFICE!!!     Oral Hygiene is also important to reduce your risk of infection.                                    Remember -  BRUSH YOUR TEETH THE MORNING OF SURGERY WITH YOUR REGULAR TOOTHPASTE  DENTURES WILL BE REMOVED PRIOR TO SURGERY PLEASE DO NOT APPLY "Poly grip" OR ADHESIVES!!!   Do NOT smoke after Midnight   Stop all vitamins and herbal supplements 7 days before surgery.   Take these medicines the morning of surgery with A SIP OF WATER: Albuterol, Flonase, Tamsulosin  You may not have any metal on your body including jewelry, and body piercing             Do not wear lotions, powders, cologne, or deodorant              Men may shave face and neck.   Do not bring valuables to the hospital. [name] IS NOT             RESPONSIBLE   FOR VALUABLES.   Contacts, glasses, dentures or bridgework may not be worn into surgery.  DO NOT BRING YOUR HOME MEDICATIONS TO THE HOSPITAL. PHARMACY WILL DISPENSE MEDICATIONS LISTED ON YOUR MEDICATION LIST TO YOU DURING YOUR ADMISSION IN THE HOSPITAL!    Patients discharged on the day of surgery will not be allowed to drive home.  Someone NEEDS to stay with you for the first 24 hours after anesthesia.   Special Instructions: Bring a copy of your healthcare power of attorney and living will documents the day of surgery if you haven't scanned them before.              Please read over the following fact sheets you were given: IF YOU HAVE QUESTIONS ABOUT YOUR PRE-OP INSTRUCTIONS PLEASE CALL [phone] 2550[name]    If you received a COVID test during your pre-op visit  it is requested that you wear a mask when out in public, stay away from anyone that may not be feeling well and notify your surgeon if you develop symptoms. If you test positive for Covid or have been in contact with anyone that has tested positive in the last 10 days please notify you surgeon.    [facility] - Preparing for Surgery Before surgery, you can play an important role.  Because skin is not sterile, your skin needs to be as free of germs as possible.  You can reduce the  number of germs on your skin by washing with CHG (chlorahexidine gluconate) soap before surgery.  CHG is an antiseptic cleaner which kills germs and bonds with the skin to continue killing germs even after washing. Please DO NOT use if you have an allergy to CHG or antibacterial soaps.  If your skin becomes reddened/irritated stop using the CHG and inform your nurse when you arrive at Short Stay. Do not shave (including legs and underarms) for at least 48 hours prior to the first CHG shower.  You may shave your face/neck.  Please follow these instructions carefully:  1.  Shower with CHG Soap the night before surgery and the  morning of surgery.  2.  If you choose to wash your hair, wash your hair first as usual with your normal  shampoo.  3.  After you shampoo, rinse your hair and body thoroughly to remove the shampoo.                             4.  Use CHG as you would any other liquid soap.  You can apply chg directly to the skin and wash.  Gently with a scrungie or clean washcloth.  5.  Apply the CHG Soap to your body ONLY FROM THE NECK DOWN.   Do   not use on face/ open                           Wound or open sores. Avoid contact with eyes, ears mouth and  genitals (private parts).                       Wash face,  Genitals (private parts) with your normal soap.             6.  Wash thoroughly, paying special attention to the area where your    surgery  will be performed.  7.  Thoroughly rinse your body with warm water from the neck down.  8.  DO NOT shower/wash with your normal soap after using and rinsing off the CHG Soap.                9.  Pat yourself dry with a clean towel.            10.  Wear clean pajamas.            11.  Place clean sheets on your bed the night of your first shower and do not  sleep with pets. Day of Surgery : Do not apply any lotions/deodorants the morning of surgery.  Please wear clean clothes to the hospital/surgery center.  FAILURE TO FOLLOW THESE  INSTRUCTIONS MAY RESULT IN THE CANCELLATION OF YOUR SURGERY  PATIENT SIGNATURE_________________________________  NURSE SIGNATURE__________________________________  ________________________________________________________________________

## 2022-11-23 ENCOUNTER — Other Ambulatory Visit: Payer: Self-pay

## 2022-11-23 ENCOUNTER — Encounter (HOSPITAL_COMMUNITY)
Admission: RE | Admit: 2022-11-23 | Discharge: 2022-11-23 | Disposition: A | Discharge status: Home / Self Care | Payer: MEDICAID | Source: Ambulatory Visit | Attending: Surgery | Admitting: Surgery

## 2022-11-23 ENCOUNTER — Encounter (HOSPITAL_COMMUNITY): Payer: Self-pay

## 2022-11-23 VITALS — BP 138/60 | HR 67 | Temp 98.5°F | Resp 14 | Ht 77.0 in | Wt 233.0 lb

## 2022-11-23 DIAGNOSIS — Z01812 Encounter for preprocedural laboratory examination: Secondary | ICD-10-CM | POA: Diagnosis not present

## 2022-11-23 DIAGNOSIS — B1920 Unspecified viral hepatitis C without hepatic coma: Secondary | ICD-10-CM | POA: Diagnosis not present

## 2022-11-23 DIAGNOSIS — Z01818 Encounter for other preprocedural examination: Secondary | ICD-10-CM

## 2022-11-23 HISTORY — DX: Acute myocardial infarction, unspecified: I21.9

## 2022-11-23 HISTORY — DX: Inflammatory liver disease, unspecified: K75.9

## 2022-11-23 LAB — COMPREHENSIVE METABOLIC PANEL
ALT: 46 U/L — ABNORMAL HIGH (ref 0–44)
AST: 44 U/L — ABNORMAL HIGH (ref 15–41)
Albumin: 4 g/dL (ref 3.5–5.0)
Alkaline Phosphatase: 55 U/L (ref 38–126)
Anion gap: 11 (ref 5–15)
BUN: 21 mg/dL (ref 8–23)
CO2: 20 mmol/L — ABNORMAL LOW (ref 22–32)
Calcium: 9.4 mg/dL (ref 8.9–10.3)
Chloride: 103 mmol/L (ref 98–111)
Creatinine, Ser: 1.3 mg/dL — ABNORMAL HIGH (ref 0.61–1.24)
GFR, Estimated: >60 mL/min (ref >60)
Glucose, Bld: 108 mg/dL — ABNORMAL HIGH (ref 70–99)
Potassium: 4.1 mmol/L (ref 3.5–5.1)
Sodium: 134 mmol/L — ABNORMAL LOW (ref 135–145)
Total Bilirubin: 1.2 mg/dL (ref 0.3–1.2)
Total Protein: 8.4 g/dL — ABNORMAL HIGH (ref 6.5–8.1)

## 2022-11-23 LAB — CBC
HCT: 44.3 % (ref 39.0–52.0)
Hemoglobin: 15 g/dL (ref 13.0–17.0)
MCH: 31.9 pg (ref 26.0–34.0)
MCHC: 33.9 g/dL (ref 30.0–36.0)
MCV: 94.3 fL (ref 80.0–100.0)
Platelets: 138 10*3/uL — ABNORMAL LOW (ref 150–400)
RBC: 4.7 MIL/uL (ref 4.22–5.81)
RDW: 12.4 % (ref 11.5–15.5)
WBC: 11 10*3/uL — ABNORMAL HIGH (ref 4.0–10.5)
nRBC: 0 % (ref 0.0–0.2)

## 2022-12-04 NOTE — Anesthesia Preprocedure Evaluation (Addendum)
Anesthesia Evaluation  Patient identified by MRN, date of birth, ID band Patient awake    Reviewed: Allergy & Precautions, NPO status , Patient's Chart, lab work & pertinent test results  Airway Mallampati: I  TM Distance: >3 FB Neck ROM: Full    Dental no notable dental hx. (+) Edentulous Upper, Edentulous Lower   Pulmonary Current Smoker and Patient abstained from smoking.   Pulmonary exam normal breath sounds clear to auscultation       Cardiovascular hypertension, Pt. on medications + Past MI (in 1980s)  Normal cardiovascular exam Rhythm:Regular Rate:Normal     Neuro/Psych  PSYCHIATRIC DISORDERS   Bipolar Disorder Schizophrenia     GI/Hepatic ,,,(+)     substance abuse (Heroin in the 1980s)  , Hepatitis -, C  Endo/Other    Renal/GU      Musculoskeletal   Abdominal   Peds  Hematology  (+) Blood dyscrasia Lab Results      Component                Value               Date                      WBC                      11.0 (H)            11/23/2022                HGB                      15.0                11/23/2022                HCT                      44.3                11/23/2022                MCV                      94.3                11/23/2022                PLT                      138 (L)             11/23/2022              Anesthesia Other Findings   Reproductive/Obstetrics                              Anesthesia Physical Anesthesia Plan  ASA: 3  Anesthesia Plan: General   Post-op Pain Management: Toradol IV (intra-op)*, Precedex and Tylenol PO (pre-op)*   Induction: Intravenous  PONV Risk Score and Plan: 2 and Treatment may vary due to age or medical condition, Midazolam, Dexamethasone and Ondansetron  Airway Management Planned: Oral ETT  Additional Equipment: None  Intra-op Plan:   Post-operative Plan: Extubation in OR  Informed Consent: I have  reviewed the patients History and Physical, chart, labs and discussed  the procedure including the risks, benefits and alternatives for the proposed anesthesia with the patient or authorized representative who has indicated his/her understanding and acceptance.     Dental advisory given  Plan Discussed with: CRNA  Anesthesia Plan Comments: (See PAT note from 8/2 by Sherlie Ban PA-C )         Anesthesia Quick Evaluation

## 2022-12-04 NOTE — Progress Notes (Signed)
Case: [id number] Date/Time: 12/05/22 0815   Procedure: LAPAROSCOPIC CHOLECYSTECTOMY   Anesthesia type: General   Pre-op diagnosis: CHOLELITHIASIS   Location: [facility] ROOM 02 / [facility]   Surgeons: Stechschulte, Hyman Hopes, MD       DISCUSSION: Ronnie Burton is a 62 year old male who presents to PAT prior to surgery above.  Past medical history significant for current smoking, chronic hep C s/p treatment, bipolar disorder, schizophrenia, hypertension, thrombocytopenia, history of substance abuse (heroin in the past), reported MI ((in the 1980s).  No prior anesthesia complications  Patient is followed by his PCP for chronic medical issues.  He had a preop evaluation on 10/29/2022 and noted to be in stable condition, with good functional status.  Cleared as low risk with no further recommendations or testing.  Paper copy is in chart.  Patient is also followed by transplant hepatology due to history of hep C with advanced hepatic fibrosis and steatosis.  Currently undergoing liver ultrasounds every 28-month for surveillance  Patient has reported to healthcare providers in the past that he had a "small heart attack" while living in [place] at the age of 28.  Also listed in his medical history is a heart murmur.  No significant heart murmur was auscultated on exam PAT.  Patient with good functional status and denies chest pain or shortness of breath at PAT visit  VS: BP 138/60   Pulse 67   Temp 36.9 C (Oral)   Resp 14   Ht 6\' 5"  (1.956 m)   Wt 105.7 kg   SpO2 99%   BMI 27.63 kg/m   PROVIDERS: Fleet Contras, MD   LABS: Labs reviewed: Acceptable for surgery. Mild hyponatremia. Mildly elevated SCr (1.3) with normal GFR >60. Chronically mildly elevated transaminases. (all labs ordered are listed, but only abnormal results are displayed)  Labs Reviewed  COMPREHENSIVE METABOLIC PANEL - Abnormal; Notable for the following components:      Result Value   Sodium 134 (*)    CO2 20 (*)    Glucose,  Bld 108 (*)    Creatinine, Ser 1.30 (*)    Total Protein 8.4 (*)    AST 44 (*)    ALT 46 (*)    All other components within normal limits  CBC - Abnormal; Notable for the following components:   WBC 11.0 (*)    Platelets 138 (*)    All other components within normal limits     IMAGES:  CT Abdomen/Pelvis 09/17/22:  IMPRESSION: 1. No acute findings are noted in the abdomen or pelvis to account for the patient's symptoms. 2. Cholelithiasis without evidence of acute cholecystitis. 3. Additional incidental findings, as above.     EKG:   CV:  Past Medical History:  Diagnosis Date   Bipolar 1 disorder (HCC)    Headache    deneis   Heart murmur    Hepatitis    hep C, treated   Hypertension    Myocardial infarction (HCC)    mild when 30   Schizophrenia (HCC)     Past Surgical History:  Procedure Laterality Date   COLONOSCOPY      MEDICATIONS:  albuterol (VENTOLIN HFA) 108 (90 Base) MCG/ACT inhaler   amoxicillin-clavulanate (AUGMENTIN) 875-125 MG tablet   fluticasone (FLONASE) 50 MCG/ACT nasal spray   ibuprofen (ADVIL,MOTRIN) 600 MG tablet   predniSONE (DELTASONE) 20 MG tablet   promethazine-dextromethorphan (PROMETHAZINE-DM) 6.25-15 MG/5ML syrup   QUEtiapine (SEROQUEL) 50 MG tablet   tamsulosin (FLOMAX) 0.4 MG CAPS capsule  traZODone (DESYREL) 150 MG tablet   Vitamin D, Ergocalciferol, (DRISDOL) 1.25 MG (50000 UNIT) CAPS capsule   No current facility-administered medications for this encounter.   Marcille Blanco [facility] Short Stay/Anesthesiology [facility] Phone [phone] 12/04/2022 10:16 AM

## 2022-12-05 ENCOUNTER — Ambulatory Visit (HOSPITAL_COMMUNITY): Payer: MEDICAID | Admitting: Physician Assistant

## 2022-12-05 ENCOUNTER — Other Ambulatory Visit: Payer: Self-pay

## 2022-12-05 ENCOUNTER — Ambulatory Visit (HOSPITAL_COMMUNITY)
Admission: RE | Admit: 2022-12-05 | Discharge: 2022-12-05 | Disposition: A | Discharge status: Home / Self Care | Payer: MEDICAID | Source: Ambulatory Visit | Attending: Surgery | Admitting: Surgery

## 2022-12-05 ENCOUNTER — Encounter (HOSPITAL_COMMUNITY)
Admission: RE | Disposition: A | Discharge status: Home / Self Care | Payer: Self-pay | Source: Ambulatory Visit | Attending: Surgery

## 2022-12-05 ENCOUNTER — Encounter (HOSPITAL_COMMUNITY): Payer: Self-pay | Admitting: Surgery

## 2022-12-05 ENCOUNTER — Ambulatory Visit (HOSPITAL_BASED_OUTPATIENT_CLINIC_OR_DEPARTMENT_OTHER): Payer: MEDICAID | Admitting: Anesthesiology

## 2022-12-05 DIAGNOSIS — F17210 Nicotine dependence, cigarettes, uncomplicated: Secondary | ICD-10-CM | POA: Diagnosis not present

## 2022-12-05 DIAGNOSIS — F319 Bipolar disorder, unspecified: Secondary | ICD-10-CM | POA: Diagnosis not present

## 2022-12-05 DIAGNOSIS — F209 Schizophrenia, unspecified: Secondary | ICD-10-CM | POA: Diagnosis not present

## 2022-12-05 DIAGNOSIS — Z79899 Other long term (current) drug therapy: Secondary | ICD-10-CM | POA: Insufficient documentation

## 2022-12-05 DIAGNOSIS — I10 Essential (primary) hypertension: Secondary | ICD-10-CM | POA: Insufficient documentation

## 2022-12-05 DIAGNOSIS — K8020 Calculus of gallbladder without cholecystitis without obstruction: Secondary | ICD-10-CM | POA: Diagnosis not present

## 2022-12-05 DIAGNOSIS — I252 Old myocardial infarction: Secondary | ICD-10-CM | POA: Diagnosis not present

## 2022-12-05 DIAGNOSIS — K8010 Calculus of gallbladder with chronic cholecystitis without obstruction: Secondary | ICD-10-CM | POA: Diagnosis present

## 2022-12-05 HISTORY — PX: CHOLECYSTECTOMY: SHX55

## 2022-12-05 SURGERY — LAPAROSCOPIC CHOLECYSTECTOMY
Anesthesia: General

## 2022-12-05 MED ORDER — KETOROLAC TROMETHAMINE 30 MG/ML IJ SOLN
INTRAMUSCULAR | Status: DC | PRN
Start: 2022-12-05 — End: 2022-12-05
  Administered 2022-12-05: 30 mg via INTRAVENOUS

## 2022-12-05 MED ORDER — ORAL CARE MOUTH RINSE: 15.0000 mL | Freq: Once | OROMUCOSAL | Status: AC

## 2022-12-05 MED ORDER — BUPIVACAINE LIPOSOME 1.3 % IJ SUSP: 20.0000 mL | Freq: Once | INTRAMUSCULAR | Status: DC

## 2022-12-05 MED ORDER — BUPIVACAINE-EPINEPHRINE 0.25% -1:200000 IJ SOLN
INTRAMUSCULAR | Status: AC
  Filled 2022-12-05: qty 1

## 2022-12-05 MED ORDER — METHOCARBAMOL 750 MG PO TABS: 750.0000 mg | ORAL_TABLET | Freq: Four times a day (QID) | ORAL | 0 refills | Status: AC | PRN

## 2022-12-05 MED ORDER — HEPARIN SODIUM (PORCINE) 5000 UNIT/ML IJ SOLN
5000.0000 [IU] | Freq: Once | INTRAMUSCULAR | Status: AC
  Administered 2022-12-05: 5000 [IU] via SUBCUTANEOUS
  Filled 2022-12-05: qty 1

## 2022-12-05 MED ORDER — ROCURONIUM BROMIDE 10 MG/ML (PF) SYRINGE
PREFILLED_SYRINGE | INTRAVENOUS | Status: DC | PRN
  Administered 2022-12-05: 60 mg via INTRAVENOUS

## 2022-12-05 MED ORDER — IBUPROFEN 800 MG PO TABS: 800.0000 mg | ORAL_TABLET | Freq: Three times a day (TID) | ORAL | 0 refills | Status: AC

## 2022-12-05 MED ORDER — CHLORHEXIDINE GLUCONATE CLOTH 2 % EX PADS: 6.0000 | MEDICATED_PAD | Freq: Once | CUTANEOUS | Status: DC

## 2022-12-05 MED ORDER — SUGAMMADEX SODIUM 200 MG/2ML IV SOLN
INTRAVENOUS | Status: DC | PRN
  Administered 2022-12-05: 120 mg via INTRAVENOUS

## 2022-12-05 MED ORDER — HYDROMORPHONE HCL 1 MG/ML IJ SOLN
INTRAMUSCULAR | Status: AC
  Administered 2022-12-05: 0.5 mg via INTRAVENOUS
  Filled 2022-12-05: qty 1

## 2022-12-05 MED ORDER — CEFAZOLIN SODIUM-DEXTROSE 2-4 GM/100ML-% IV SOLN
2.0000 g | INTRAVENOUS | Status: AC
  Administered 2022-12-05: 2 g via INTRAVENOUS
  Filled 2022-12-05: qty 100

## 2022-12-05 MED ORDER — DEXAMETHASONE SODIUM PHOSPHATE 10 MG/ML IJ SOLN
INTRAMUSCULAR | Status: DC | PRN
  Administered 2022-12-05: 10 mg via INTRAVENOUS

## 2022-12-05 MED ORDER — EPHEDRINE SULFATE-NACL 50-0.9 MG/10ML-% IV SOSY
PREFILLED_SYRINGE | INTRAVENOUS | Status: DC | PRN
  Administered 2022-12-05 (×2): 10 mg via INTRAVENOUS

## 2022-12-05 MED ORDER — HYDROMORPHONE HCL 1 MG/ML IJ SOLN
0.2500 mg | INTRAMUSCULAR | Status: DC | PRN
  Administered 2022-12-05: 0.5 mg via INTRAVENOUS

## 2022-12-05 MED ORDER — DEXMEDETOMIDINE HCL IN NACL 80 MCG/20ML IV SOLN
INTRAVENOUS | Status: DC | PRN
  Administered 2022-12-05: 12 ug via INTRAVENOUS

## 2022-12-05 MED ORDER — BUPIVACAINE LIPOSOME 1.3 % IJ SUSP
INTRAMUSCULAR | Status: AC
  Filled 2022-12-05: qty 20

## 2022-12-05 MED ORDER — DEXMEDETOMIDINE HCL IN NACL 80 MCG/20ML IV SOLN
INTRAVENOUS | Status: AC
  Filled 2022-12-05: qty 20

## 2022-12-05 MED ORDER — OXYCODONE HCL 5 MG PO TABS
ORAL_TABLET | ORAL | Status: AC
  Filled 2022-12-05: qty 1

## 2022-12-05 MED ORDER — ONDANSETRON HCL 4 MG/2ML IJ SOLN: 4.0000 mg | Freq: Once | INTRAMUSCULAR | Status: DC | PRN

## 2022-12-05 MED ORDER — GABAPENTIN 300 MG PO CAPS
300.0000 mg | ORAL_CAPSULE | ORAL | Status: AC
  Administered 2022-12-05: 300 mg via ORAL
  Filled 2022-12-05: qty 1

## 2022-12-05 MED ORDER — ACETAMINOPHEN 500 MG PO TABS
1000.0000 mg | ORAL_TABLET | ORAL | Status: AC
  Administered 2022-12-05: 1000 mg via ORAL
  Filled 2022-12-05: qty 2

## 2022-12-05 MED ORDER — 0.9 % SODIUM CHLORIDE (POUR BTL) OPTIME
TOPICAL | Status: DC | PRN
  Administered 2022-12-05: 1000 mL

## 2022-12-05 MED ORDER — PROPOFOL 10 MG/ML IV BOLUS
INTRAVENOUS | Status: DC | PRN
  Administered 2022-12-05: 140 mg via INTRAVENOUS

## 2022-12-05 MED ORDER — OXYCODONE HCL 5 MG/5ML PO SOLN: 5.0000 mg | Freq: Once | ORAL | Status: AC | PRN

## 2022-12-05 MED ORDER — ONDANSETRON HCL 4 MG/2ML IJ SOLN
INTRAMUSCULAR | Status: DC | PRN
  Administered 2022-12-05: 4 mg via INTRAVENOUS

## 2022-12-05 MED ORDER — MIDAZOLAM HCL 2 MG/2ML IJ SOLN
INTRAMUSCULAR | Status: AC
  Filled 2022-12-05: qty 2

## 2022-12-05 MED ORDER — KETOROLAC TROMETHAMINE 30 MG/ML IJ SOLN
INTRAMUSCULAR | Status: AC
  Filled 2022-12-05: qty 1

## 2022-12-05 MED ORDER — ONDANSETRON HCL 4 MG/2ML IJ SOLN
INTRAMUSCULAR | Status: AC
  Filled 2022-12-05: qty 2

## 2022-12-05 MED ORDER — LIDOCAINE 2% (20 MG/ML) 5 ML SYRINGE
INTRAMUSCULAR | Status: DC | PRN
  Administered 2022-12-05: 60 mg via INTRAVENOUS

## 2022-12-05 MED ORDER — FENTANYL CITRATE (PF) 250 MCG/5ML IJ SOLN
INTRAMUSCULAR | Status: DC | PRN
  Administered 2022-12-05: 100 ug via INTRAVENOUS
  Administered 2022-12-05: 50 ug via INTRAVENOUS

## 2022-12-05 MED ORDER — BUPIVACAINE-EPINEPHRINE 0.25% -1:200000 IJ SOLN
INTRAMUSCULAR | Status: DC | PRN
  Administered 2022-12-05: 30 mL

## 2022-12-05 MED ORDER — OXYCODONE-ACETAMINOPHEN 5-325 MG PO TABS: 1.0000 | ORAL_TABLET | ORAL | 0 refills | Status: AC | PRN

## 2022-12-05 MED ORDER — ACETAMINOPHEN 500 MG PO TABS: 1000.0000 mg | ORAL_TABLET | Freq: Once | ORAL | Status: DC

## 2022-12-05 MED ORDER — ROCURONIUM BROMIDE 10 MG/ML (PF) SYRINGE
PREFILLED_SYRINGE | INTRAVENOUS | Status: AC
  Filled 2022-12-05: qty 10

## 2022-12-05 MED ORDER — DEXAMETHASONE SODIUM PHOSPHATE 10 MG/ML IJ SOLN
INTRAMUSCULAR | Status: AC
  Filled 2022-12-05: qty 1

## 2022-12-05 MED ORDER — KETOROLAC TROMETHAMINE 15 MG/ML IJ SOLN: 15.0000 mg | INTRAMUSCULAR | Status: DC

## 2022-12-05 MED ORDER — FENTANYL CITRATE (PF) 250 MCG/5ML IJ SOLN
INTRAMUSCULAR | Status: AC
  Filled 2022-12-05: qty 5

## 2022-12-05 MED ORDER — MIDAZOLAM HCL 2 MG/2ML IJ SOLN
INTRAMUSCULAR | Status: DC | PRN
  Administered 2022-12-05: 2 mg via INTRAVENOUS

## 2022-12-05 MED ORDER — OXYCODONE HCL 5 MG PO TABS
5.0000 mg | ORAL_TABLET | Freq: Once | ORAL | Status: AC | PRN
  Administered 2022-12-05: 5 mg via ORAL

## 2022-12-05 MED ORDER — LIDOCAINE HCL (PF) 2 % IJ SOLN
INTRAMUSCULAR | Status: AC
  Filled 2022-12-05: qty 5

## 2022-12-05 MED ORDER — LACTATED RINGERS IV SOLN: INTRAVENOUS | Status: DC

## 2022-12-05 MED ORDER — FENTANYL CITRATE PF 50 MCG/ML IJ SOSY
PREFILLED_SYRINGE | INTRAMUSCULAR | Status: AC
  Filled 2022-12-05: qty 1

## 2022-12-05 MED ORDER — PROPOFOL 10 MG/ML IV BOLUS
INTRAVENOUS | Status: AC
  Filled 2022-12-05: qty 20

## 2022-12-05 MED ORDER — KETOROLAC TROMETHAMINE 30 MG/ML IJ SOLN: 30.0000 mg | Freq: Once | INTRAMUSCULAR | Status: DC | PRN

## 2022-12-05 MED ORDER — CHLORHEXIDINE GLUCONATE 0.12 % MT SOLN
15.0000 mL | Freq: Once | OROMUCOSAL | Status: AC
  Administered 2022-12-05: 15 mL via OROMUCOSAL

## 2022-12-05 MED ORDER — EPHEDRINE 5 MG/ML INJ
INTRAVENOUS | Status: AC
  Filled 2022-12-05: qty 5

## 2022-12-05 SURGICAL SUPPLY — 43 items
ADH SKN CLS APL DERMABOND .7 (GAUZE/BANDAGES/DRESSINGS) ×1
APL PRP STRL LF DISP 70% ISPRP (MISCELLANEOUS) ×1
APPLIER CLIP ROT 10 11.4 M/L (STAPLE) ×1
APR CLP MED LRG 11.4X10 (STAPLE) ×1
BAG COUNTER SPONGE SURGICOUNT (BAG) IMPLANT
BAG SPEC RTRVL 10 TROC 200 (ENDOMECHANICALS) ×1
BAG SPNG CNTER NS LX DISP (BAG)
CABLE HIGH FREQUENCY MONO STRZ (ELECTRODE) ×1 IMPLANT
CATH URETL OPEN 5X70 (CATHETERS) IMPLANT
CHLORAPREP W/TINT 26 (MISCELLANEOUS) ×1 IMPLANT
CLIP APPLIE ROT 10 11.4 M/L (STAPLE) ×1 IMPLANT
COVER MAYO STAND XLG (MISCELLANEOUS) ×1 IMPLANT
COVER SURGICAL LIGHT HANDLE (MISCELLANEOUS) ×1 IMPLANT
DERMABOND ADVANCED .7 DNX12 (GAUZE/BANDAGES/DRESSINGS) ×1 IMPLANT
DRAPE C-ARM 42X120 X-RAY (DRAPES) IMPLANT
ELECT REM PT RETURN 15FT ADLT (MISCELLANEOUS) ×1 IMPLANT
ENDOLOOP SUT PDS II 0 18 (SUTURE) ×1 IMPLANT
GLOVE BIO SURGEON STRL SZ7.5 (GLOVE) ×1 IMPLANT
GLOVE INDICATOR 8.0 STRL GRN (GLOVE) ×1 IMPLANT
GOWN STRL REUS W/ TWL XL LVL3 (GOWN DISPOSABLE) ×2 IMPLANT
GOWN STRL REUS W/TWL XL LVL3 (GOWN DISPOSABLE) ×2
GRASPER SUT TROCAR 14GX15 (MISCELLANEOUS) IMPLANT
HEMOSTAT SNOW SURGICEL 2X4 (HEMOSTASIS) IMPLANT
IRRIG SUCT STRYKERFLOW 2 WTIP (MISCELLANEOUS) ×1
IRRIGATION SUCT STRKRFLW 2 WTP (MISCELLANEOUS) ×1 IMPLANT
IV CATH 14GX2 1/4 (CATHETERS) ×1 IMPLANT
KIT BASIN OR (CUSTOM PROCEDURE TRAY) ×1 IMPLANT
KIT TURNOVER KIT A (KITS) IMPLANT
NDL INSUFFLATION 14GA 120MM (NEEDLE) ×1 IMPLANT
NEEDLE INSUFFLATION 14GA 120MM (NEEDLE) ×1 IMPLANT
PENCIL SMOKE EVACUATOR (MISCELLANEOUS) IMPLANT
POUCH RETRIEVAL ECOSAC 10 (ENDOMECHANICALS) ×1 IMPLANT
SCISSORS LAP 5X35 DISP (ENDOMECHANICALS) ×1 IMPLANT
SET TUBE SMOKE EVAC HIGH FLOW (TUBING) ×1 IMPLANT
SLEEVE Z-THREAD 5X100MM (TROCAR) ×2 IMPLANT
SPIKE FLUID TRANSFER (MISCELLANEOUS) ×1 IMPLANT
STOPCOCK 4 WAY LG BORE MALE ST (IV SETS) IMPLANT
SUT MNCRL AB 4-0 PS2 18 (SUTURE) ×1 IMPLANT
TOWEL OR 17X26 10 PK STRL BLUE (TOWEL DISPOSABLE) ×1 IMPLANT
TOWEL OR NON WOVEN STRL DISP B (DISPOSABLE) IMPLANT
TRAY LAPAROSCOPIC (CUSTOM PROCEDURE TRAY) ×1 IMPLANT
TROCAR ADV FIXATION 12X100MM (TROCAR) ×1 IMPLANT
TROCAR Z-THREAD OPTICAL 5X100M (TROCAR) ×1 IMPLANT

## 2022-12-05 NOTE — H&P (Signed)
Admitting Physician: Hyman Hopes Calaya Gildner  Service: General Surgery  CC: Gallbladder problems  Subjective   HPI: Ronnie Burton is an 62 y.o. male who is here for cholecystectomy  Past Medical History:  Diagnosis Date   Bipolar 1 disorder (HCC)    Headache    deneis   Heart murmur    Hepatitis    hep C, treated   Hypertension    Myocardial infarction (HCC)    mild when 30   Schizophrenia (HCC)     Past Surgical History:  Procedure Laterality Date   COLONOSCOPY      Family History  Problem Relation Age of Onset   Alcoholism Father     Social:  reports that he has been smoking cigarettes. He has a 5 pack-year smoking history. He has never used smokeless tobacco. He reports current alcohol use of about 8.0 standard drinks of alcohol per week. He reports that he does not use drugs.  Allergies: No Known Allergies  Medications: Current Outpatient Medications  Medication Instructions   albuterol (VENTOLIN HFA) 108 (90 Base) MCG/ACT inhaler 1-2 puffs, Inhalation, Every 6 hours PRN   amoxicillin-clavulanate (AUGMENTIN) 875-125 MG tablet 1 tablet, Oral, Every 12 hours   fluticasone (FLONASE) 50 MCG/ACT nasal spray 1 spray, Each Nare, Daily PRN   ibuprofen (ADVIL) 600 mg, Oral, Every 6 hours PRN   predniSONE (DELTASONE) 40 mg, Oral, Daily with breakfast   promethazine-dextromethorphan (PROMETHAZINE-DM) 6.25-15 MG/5ML syrup 5 mLs, Oral, At bedtime PRN   QUEtiapine (SEROQUEL) 50 MG tablet Take 1 tablet (50 mg) twice daily & 3 tablets (150 mg) at bedtime: For mood control   tamsulosin (FLOMAX) 0.4 mg, Oral   traZODone (DESYREL) 150 mg, Oral, Daily at bedtime, For sleep   Vitamin D (Ergocalciferol) (DRISDOL) 50,000 Units, Oral, Weekly    ROS - all of the below systems have been reviewed with the patient and positives are indicated with bold text General: chills, fever or night sweats Eyes: blurry vision or double vision ENT: epistaxis or sore throat Allergy/Immunology:  itchy/watery eyes or nasal congestion Hematologic/Lymphatic: bleeding problems, blood clots or swollen lymph nodes Endocrine: temperature intolerance or unexpected weight changes Breast: new or changing breast lumps or nipple discharge Resp: cough, shortness of breath, or wheezing CV: chest pain or dyspnea on exertion GI: as per HPI GU: dysuria, trouble voiding, or hematuria MSK: joint pain or joint stiffness Neuro: TIA or stroke symptoms Derm: pruritus and skin lesion changes Psych: anxiety and depression  Objective   PE Blood pressure (!) 140/74, pulse (!) 54, temperature 97.9 F (36.6 C), temperature source Oral, resp. rate 16, weight 105.7 kg, SpO2 98%. Constitutional: NAD; conversant; no deformities Eyes: Moist conjunctiva; no lid lag; anicteric; PERRL Neck: Trachea midline; no thyromegaly Lungs: Normal respiratory effort; no tactile fremitus CV: RRR; no palpable thrills; no pitting edema GI: Abd Soft, nontender; no palpable hepatosplenomegaly MSK: Normal range of motion of extremities; no clubbing/cyanosis Psychiatric: Appropriate affect; alert and oriented x3 Lymphatic: No palpable cervical or axillary lymphadenopathy  No results found for this or any previous visit (from the past 24 hour(s)).  Imaging Orders  No imaging studies ordered today    RUQ [place] 07/10/22 1. Cholelithiasis in the gallbladder. The gallbladder is otherwise normal. 2. Diffuse increased echogenicity throughout the liver is nonspecific and may be seen with hepatic steatosis or other underlying intrinsic liver disease.   Assessment and Plan    Gallstones   Mr. Cluster has epigastric abdominal pain and gallstones. We discussed the  pathophysiology of gallstones. We discussed laparoscopic cholecystectomy. We discussed the procedure itself as well as its risk, benefits, and alternatives. We discussed the option of continued observation and trial of PPI or additional testing for another cause of his  abdominal pain. He would rather just to have his gallbladder out at this point as he is convinced it is his gallbladder causing the pain. I explained there is a chance that the pain continues after surgery in which case we could look into other causes, but I agree that it is reasonable to take out his gallbladder at this point.  After full discussion all questions answered the patient granted consent to proceed.     Ronnie Ore, MD  [facility] Use [email or web address] to contact on call provider

## 2022-12-05 NOTE — Anesthesia Postprocedure Evaluation (Signed)
Anesthesia Post Note  Patient: Ronnie Burton  Procedure(s) Performed: LAPAROSCOPIC CHOLECYSTECTOMY     Patient location during evaluation: PACU Anesthesia Type: General Level of consciousness: awake and alert Pain management: pain level controlled Vital Signs Assessment: post-procedure vital signs reviewed and stable Respiratory status: spontaneous breathing, nonlabored ventilation, respiratory function stable and patient connected to nasal cannula oxygen Cardiovascular status: blood pressure returned to baseline and stable Postop Assessment: no apparent nausea or vomiting Anesthetic complications: no   No notable events documented.  Last Vitals:  Vitals:   12/05/22 1045 12/05/22 1100  BP: 121/74 129/73  Pulse: (!) 54 (!) 54  Resp: 16   Temp: (!) 36.4 C   SpO2: 97% 93%    Last Pain:  Vitals:   12/05/22 1045  TempSrc:   PainSc: 2                  Trevor Iha

## 2022-12-05 NOTE — Discharge Instructions (Signed)
 CHOLECYSTECTOMY POST OPERATIVE INSTRUCTIONS  Thinking Clearly  The anesthesia may cause you to feel different for 1 or 2 days. Do not drive, drink alcohol, or make any big decisions for at least 2 days.  Nutrition When you wake up, you will be able to drink small amounts of liquid. If you do not feel sick, you can slowly advance your diet to regular foods. Continue to drink lots of fluids, usually about 8 to 10 glasses per day. Eat a high-fiber diet so you don't strain during bowel movements. High-Fiber Foods Foods high in fiber include beans, bran cereals and whole-grain breads, peas, dried fruit (figs, apricots, and dates), raspberries, blackberries, strawberries, sweet corn, broccoli, baked potatoes with skin, plums, pears, apples, greens, and nuts. Activity Slowly increase your activity. Be sure to get up and walk every hour or so to prevent blood clots. No heavy lifting or strenuous activity for 4 weeks following surgery to prevent hernias at your incision sites It is normal to feel tired. You may need more sleep than usual.  Get your rest but make sure to get up and move around frequently to prevent blood clots and pneumonia.  Work and Return to School You can go back to work when you feel well enough. Discuss the timing with your surgeon. You can usually go back to school or work 1 week after an operation. If your work requires heavy lifting or strenuous activity you need to be placed on light duty for 4 weeks following surgery. You can return to gym class, sports or other physical activities 4 weeks after surgery.  Wound Care Always wash your hands before and after touching near your incision site. Do not soak in a bathtub until cleared at your follow up appointment. You may take a shower 24 hours after surgery. A small amount of drainage from the incision is normal. If the drainage is thick and yellow or the site is red, you may have an infection, so call your surgeon. If you  have a drain in one of your incisions, it will be taken out in office when the drainage stops. Steri-Strips will fall off in 7 to 10 days or they will be removed during your first office visit. If you have dermabond glue covering over the incision, allow the glue to flake off on its own. Avoid wearing tight or rough clothing. It may rub your incisions and make it harder for them to heal. Protect the new skin, especially from the sun. The sun can burn and cause darker scarring. Your scar will heal in about 4 to 6 weeks and will become softer and continue to fade over the next year.  The cosmetic appearance of the incisions will improve over the course of the first year after surgery. Sensation around your incision will return in a few weeks or months.  Bowel Movements After intestinal surgery, you may have loose watery stools for several days. If watery diarrhea lasts longer than 3 days, contact your surgeon. Pain medication (narcotics) can cause constipation. Increase the fiber in your diet with high-fiber foods if you are constipated. You can take an over the counter stool softener like Colace to avoid constipation.  Additional over the counter medications can also be used if Colace isn't sufficient (for example, Milk of Magnesia or Miralax).  Pain The amount of pain is different for each person. Some people need only 1 to 3 doses of pain control medication, while others need more. Take alternating doses of tylenol   and ibuprofen around the clock for the first five days following surgery.  This will provide a baseline of pain control and help with inflammation.  Take the narcotic pain medication in addition if needed for severe pain.  Contact Your Surgeon at [phone], if you have: Pain in your right upper abdomen like a gallbladder attack. Pain that will not go away Pain that gets worse A fever of more than 101F (38.3C) Repeated vomiting Swelling, redness, bleeding, or bad-smelling  drainage from your wound site Strong abdominal pain No bowel movement or unable to pass gas for 3 days Watery diarrhea lasting longer than 3 days  Pain Control The goal of pain control is to minimize pain, keep you moving and help you heal. Your surgical team will work with you on your pain plan. Most often a combination of therapies and medications are used to control your pain. You may also be given medication (local anesthetic) at the surgical site. This may help control your pain for several days. Extreme pain puts extra stress on your body at a time when your body needs to focus on healing. Do not wait until your pain has reached a level "10" or is unbearable before telling your doctor or nurse. It is much easier to control pain before it becomes severe. Following a laparoscopic procedure, pain is sometimes felt in the shoulder. This is due to the gas inserted into your abdomen during the procedure. Moving and walking helps to decrease the gas and the right shoulder pain.  Use the guide below for ways to manage your post-operative pain. Learn more by going to facs.org/safepaincontrol.  How Intense Is My Pain Common Therapies to Feel Better       I hardly notice my pain, and it does not interfere with my activities.  I notice my pain and it distracts me, but I can still do activities (sitting up, walking, standing).  Non-Medication Therapies  Ice (in a bag, applied over clothing at the surgical site), elevation, rest, meditation, massage, distraction (music, TV, play) walking and mild exercise Splinting the abdomen with pillows +  Non-Opioid Medications Acetaminophen (Tylenol) Non-steroidal anti-inflammatory drugs (NSAIDS) Aspirin, Ibuprofen (Motrin, Advil) Naproxen (Aleve) Take these as needed, when you feel pain. Both acetaminophen and NSAIDs help to decrease pain and swelling (inflammation).      My pain is hard to ignore and is more noticeable even when I rest.  My  pain interferes with my usual activities.  Non-Medication Therapies  +  Non-Opioid medications  Take on a regular schedule (around-the-clock) instead of as needed. (For example, Tylenol every 6 hours at 9:00 am, 3:00 pm, 9:00 pm, 3:00 am and Motrin every 6 hours at 12:00 am, 6:00 am, 12:00 pm, 6:00 pm)         I am focused on my pain, and I am not doing my daily activities.  I am groaning in pain, and I cannot sleep. I am unable to do anything.  My pain is as bad as it could be, and nothing else matters.  Non-Medication Therapies  +  Around-the-Clock Non-Opioid Medications  +  Short-acting opioids  Opioids should be used with other medications to manage severe pain. Opioids block pain and give a feeling of euphoria (feel high). Addiction, a serious side effect of opioids, is rare with short-term (a few days) use.  Examples of short-acting opioids include: Tramadol (Ultram), Hydrocodone (Norco, Vicodin), Hydromorphone (Dilaudid), Oxycodone (Oxycontin)     The above directions have been adapted from   the American College [employer] Surgical Patient Education Program.  Please refer to the ACS website if needed: https://www.facs.org/-/media/files/education/patient-ed/cholesys.ashx.   Paul Stechschulte, MD [facility] [address], [address], [place], [place]  [address] ?  [address] [address], [place], [place]   [address] [phone] ? [phone] ? FAX [phone] Web site: [email or web address]  

## 2022-12-05 NOTE — Transfer of Care (Signed)
Immediate Anesthesia Transfer of Care Note  Patient: Ronnie Burton  Procedure(s) Performed: LAPAROSCOPIC CHOLECYSTECTOMY  Patient Location: PACU  Anesthesia Type:General  Level of Consciousness: awake and oriented  Airway & Oxygen Therapy: Patient Spontanous Breathing and Patient connected to face mask oxygen  Post-op Assessment: Report given to RN and Post -op Vital signs reviewed and stable  Post vital signs: Reviewed and stable  Last Vitals:  Vitals Value Taken Time  BP 145/74 12/05/22 0939  Temp    Pulse 63 12/05/22 0939  Resp 25 12/05/22 0939  SpO2 100 % 12/05/22 0939  Vitals shown include unfiled device data.  Last Pain:  Vitals:   12/05/22 0724  TempSrc: Oral  PainSc:          Complications: No notable events documented.

## 2022-12-05 NOTE — Anesthesia Procedure Notes (Signed)
Procedure Name: Intubation Date/Time: 12/05/2022 8:42 AM  Performed by: Florene Route, CRNAPre-anesthesia Checklist: Patient identified, Emergency Drugs available, Suction available and Patient being monitored Patient Re-evaluated:Patient Re-evaluated prior to induction Oxygen Delivery Method: Circle system utilized Preoxygenation: Pre-oxygenation with 100% oxygen Induction Type: IV induction Ventilation: Mask ventilation without difficulty Laryngoscope Size: Miller and 3 Grade View: Grade I Tube type: Oral Tube size: 8.0 mm Number of attempts: 1 Airway Equipment and Method: Stylet and Oral airway Placement Confirmation: ETT inserted through vocal cords under direct vision, positive ETCO2 and breath sounds checked- equal and bilateral Secured at: 23 cm Tube secured with: Tape Dental Injury: Teeth and Oropharynx as per pre-operative assessment

## 2022-12-05 NOTE — Op Note (Signed)
Patient: Ronnie Burton ([birth year], [id number])  Date of Surgery: 12/05/2022   Preoperative Diagnosis: CHOLELITHIASIS   Postoperative Diagnosis: CHOLELITHIASIS   Surgical Procedure: LAPAROSCOPIC CHOLECYSTECTOMY:    Operative Team Members:  Surgeons and Role:    * Sayer Masini, Hyman Hopes, MD - Primary   Anesthesiologist: Trevor Iha, MD CRNA: Florene Route, CRNA   Anesthesia: General   Fluids:  Total I/O In: -  Out: 25 [Blood:25]  Complications: None  Drains:  none   Specimen:  ID Type Source Tests Collected by Time Destination  1 : Gallbladder Tissue PATH Gallbladder SURGICAL PATHOLOGY Yamil Dougher, Hyman Hopes, MD 12/05/2022 [phone]      Disposition:  PACU - hemodynamically stable.  Plan of Care: Discharge to home after PACU    Indications for Procedure: Deveion Bartles is a 62 y.o. male who presented with abdominal pain.  History, physical and imaging was concerning for cholecystitis.  Laparoscopic cholecystectomy was recommended for the patient.  The procedure itself, as well as the risks, benefits and alternatives were discussed with the patient.  Risks discussed included but were not limited to the risk of infection, bleeding, damage to nearby structures, need to convert to open procedure, incisional hernia, bile leak, common bile duct injury and the need for additional procedures or surgeries.  With this discussion complete and all questions answered the patient granted consent to proceed.  Findings: Gallstones, inflamed gallbladder  Infection status: Patient: Private Patient Elective Case Case: Elective Infection Present At Time Of Surgery (PATOS): None   Description of Procedure:   On the date stated above, the patient was taken to the operating room suite and placed in supine positioning.  Sequential compression devices were placed on the lower extremities to prevent blood clots.  General endotracheal anesthesia was induced. Preoperative antibiotics were  given.  The patient's abdomen was prepped and draped in the usual sterile fashion.  A time-out was completed verifying the correct patient, procedure, positioning and equipment needed for the case.  We began by anesthetizing the skin with local anesthetic and then making a 5 mm incision just below the umbilicus.  We dissected through the subcutaneous tissues to the fascia.  The fascia was grasped and elevated using a Kocher clamp.  A Veress needle was inserted into the abdomen and the abdomen was insufflated to 15 mmHg.  A 5 mm trocar was inserted in this position under optical guidance and then the abdomen was inspected.  There was no trauma to the underlying viscera with initial trocar placement.  Any abnormal findings, other than inflammation in the right upper quadrant, are listed above in the findings section.  Three additional trocars were placed, one 12 mm trocar in the subxiphoid position, one 5 mm trocar in the midline epigastric area and one 5mm trocar in the right upper quadrant subcostally.  These were placed under direct vision without any trauma to the underlying viscera.    The patient was then placed in head up, left side down positioning.  The gallbladder was identified and dissected free from its attachments to the omentum allowing the duodenum to fall away.  The infundibulum of the gallbladder was dissected free working laterally to medially.  The cystic duct and cystic artery were dissected free from surrounding connective tissue.  The infundibulum of the gallbladder was dissected off the cystic plate.  A critical view of safety was obtained with the cystic duct and cystic artery being cleared of connective tissues and clearly the only two structures entering into the  gallbladder with the liver clearly visible behind.  Clips were then applied to the cystic duct and cystic artery and then these structures were divided.  A PDS Endoloop was placed on the cystic duct stump The gallbladder was  dissected off the cystic plate, placed in an endocatch bag and removed from the 12 mm subxiphoid port site.  The clips were inspected and appeared effective.  The cystic plate was inspected and hemostasis was obtained using electrocautery.  A suction irrigator was used to clean the operative field.  Attention was turned to closure.  The 12 mm subxiphoid port site was closed using a 0-vicryl suture on a fascial suture passer.  The abdomen was desufflated.  The skin was closed using 4-0 monocryl and dermabond.  All sponge and needle counts were correct at the conclusion of the case.    Ivar Drape, MD General, Bariatric, & Minimally Invasive Surgery [facility] [place]

## 2022-12-06 ENCOUNTER — Encounter (HOSPITAL_COMMUNITY): Payer: Self-pay | Admitting: Surgery

## 2022-12-06 LAB — SURGICAL PATHOLOGY

## 2022-12-21 ENCOUNTER — Inpatient Hospital Stay (HOSPITAL_COMMUNITY)
Admission: EM | Admit: 2022-12-21 | Discharge: 2022-12-23 | DRG: 379 | Disposition: A | Discharge status: Home / Self Care | Payer: MEDICAID | Attending: Family Medicine | Admitting: Family Medicine

## 2022-12-21 ENCOUNTER — Emergency Department (HOSPITAL_COMMUNITY): Payer: MEDICAID

## 2022-12-21 ENCOUNTER — Encounter (HOSPITAL_COMMUNITY): Payer: Self-pay

## 2022-12-21 ENCOUNTER — Other Ambulatory Visit: Payer: Self-pay

## 2022-12-21 DIAGNOSIS — K922 Gastrointestinal hemorrhage, unspecified: Secondary | ICD-10-CM | POA: Diagnosis present

## 2022-12-21 DIAGNOSIS — F17210 Nicotine dependence, cigarettes, uncomplicated: Secondary | ICD-10-CM | POA: Diagnosis present

## 2022-12-21 DIAGNOSIS — K3189 Other diseases of stomach and duodenum: Secondary | ICD-10-CM | POA: Diagnosis present

## 2022-12-21 DIAGNOSIS — I252 Old myocardial infarction: Secondary | ICD-10-CM

## 2022-12-21 DIAGNOSIS — Z791 Long term (current) use of non-steroidal anti-inflammatories (NSAID): Secondary | ICD-10-CM

## 2022-12-21 DIAGNOSIS — Z86010 Personal history of colonic polyps: Secondary | ICD-10-CM

## 2022-12-21 DIAGNOSIS — Z79899 Other long term (current) drug therapy: Secondary | ICD-10-CM | POA: Diagnosis not present

## 2022-12-21 DIAGNOSIS — F1010 Alcohol abuse, uncomplicated: Secondary | ICD-10-CM

## 2022-12-21 DIAGNOSIS — K254 Chronic or unspecified gastric ulcer with hemorrhage: Secondary | ICD-10-CM | POA: Diagnosis present

## 2022-12-21 DIAGNOSIS — I10 Essential (primary) hypertension: Secondary | ICD-10-CM | POA: Diagnosis present

## 2022-12-21 DIAGNOSIS — K2970 Gastritis, unspecified, without bleeding: Secondary | ICD-10-CM | POA: Diagnosis present

## 2022-12-21 DIAGNOSIS — Z8619 Personal history of other infectious and parasitic diseases: Secondary | ICD-10-CM

## 2022-12-21 DIAGNOSIS — K920 Hematemesis: Secondary | ICD-10-CM | POA: Diagnosis present

## 2022-12-21 DIAGNOSIS — T182XXA Foreign body in stomach, initial encounter: Secondary | ICD-10-CM | POA: Diagnosis present

## 2022-12-21 DIAGNOSIS — K209 Esophagitis, unspecified without bleeding: Secondary | ICD-10-CM | POA: Diagnosis present

## 2022-12-21 DIAGNOSIS — Z9049 Acquired absence of other specified parts of digestive tract: Secondary | ICD-10-CM

## 2022-12-21 DIAGNOSIS — B1920 Unspecified viral hepatitis C without hepatic coma: Secondary | ICD-10-CM | POA: Diagnosis present

## 2022-12-21 DIAGNOSIS — K7402 Hepatic fibrosis, advanced fibrosis: Secondary | ICD-10-CM | POA: Diagnosis present

## 2022-12-21 LAB — PROTIME-INR
INR: 1 (ref 0.8–1.2)
Prothrombin Time: 13.7 s (ref 11.4–15.2)

## 2022-12-21 LAB — COMPREHENSIVE METABOLIC PANEL WITH GFR
ALT: 52 U/L — ABNORMAL HIGH (ref 0–44)
AST: 47 U/L — ABNORMAL HIGH (ref 15–41)
Albumin: 3.8 g/dL (ref 3.5–5.0)
Alkaline Phosphatase: 64 U/L (ref 38–126)
Anion gap: 11 (ref 5–15)
BUN: 18 mg/dL (ref 8–23)
CO2: 18 mmol/L — ABNORMAL LOW (ref 22–32)
Calcium: 8.8 mg/dL — ABNORMAL LOW (ref 8.9–10.3)
Chloride: 104 mmol/L (ref 98–111)
Creatinine, Ser: 1.23 mg/dL (ref 0.61–1.24)
GFR, Estimated: >60 mL/min (ref >60)
Glucose, Bld: 89 mg/dL (ref 70–99)
Potassium: 3.4 mmol/L — ABNORMAL LOW (ref 3.5–5.1)
Sodium: 133 mmol/L — ABNORMAL LOW (ref 135–145)
Total Bilirubin: 0.8 mg/dL (ref 0.3–1.2)
Total Protein: 7.9 g/dL (ref 6.5–8.1)

## 2022-12-21 LAB — URINALYSIS, ROUTINE W REFLEX MICROSCOPIC
Bilirubin Urine: NEGATIVE
Glucose, UA: NEGATIVE mg/dL
Hgb urine dipstick: NEGATIVE
Ketones, ur: NEGATIVE mg/dL
Leukocytes,Ua: NEGATIVE
Nitrite: NEGATIVE
Protein, ur: NEGATIVE mg/dL
Specific Gravity, Urine: 1.002 — ABNORMAL LOW (ref 1.005–1.030)
pH: 6 (ref 5.0–8.0)

## 2022-12-21 LAB — TYPE AND SCREEN
ABO/RH(D): O POS
Antibody Screen: NEGATIVE

## 2022-12-21 LAB — LIPASE, BLOOD: Lipase: 42 U/L (ref 11–51)

## 2022-12-21 LAB — CBC
HCT: 41.1 % (ref 39.0–52.0)
Hemoglobin: 13.9 g/dL (ref 13.0–17.0)
MCH: 31.5 pg (ref 26.0–34.0)
MCHC: 33.8 g/dL (ref 30.0–36.0)
MCV: 93.2 fL (ref 80.0–100.0)
Platelets: 151 10*3/uL (ref 150–400)
RBC: 4.41 MIL/uL (ref 4.22–5.81)
RDW: 12.1 % (ref 11.5–15.5)
WBC: 9.7 10*3/uL (ref 4.0–10.5)
nRBC: 0 % (ref 0.0–0.2)

## 2022-12-21 LAB — ABO/RH: ABO/RH(D): O POS

## 2022-12-21 LAB — ETHANOL: Alcohol, Ethyl (B): 118 mg/dL — ABNORMAL HIGH (ref <10)

## 2022-12-21 LAB — POC OCCULT BLOOD, ED: Fecal Occult Bld: NEGATIVE

## 2022-12-21 MED ORDER — POTASSIUM CHLORIDE 2 MEQ/ML IV SOLN
INTRAVENOUS | Status: DC
  Filled 2022-12-21: qty 1000

## 2022-12-21 MED ORDER — PANTOPRAZOLE INFUSION (NEW) - SIMPLE MED
8.0000 mg/h | INTRAVENOUS | Status: DC
  Administered 2022-12-21 – 2022-12-22 (×2): 8 mg/h via INTRAVENOUS
  Filled 2022-12-21 (×2): qty 80

## 2022-12-21 MED ORDER — ACETAMINOPHEN 650 MG RE SUPP: 650.0000 mg | Freq: Four times a day (QID) | RECTAL | Status: DC | PRN

## 2022-12-21 MED ORDER — PANTOPRAZOLE SODIUM 40 MG IV SOLR: 40.0000 mg | Freq: Two times a day (BID) | INTRAVENOUS | Status: DC

## 2022-12-21 MED ORDER — SODIUM CHLORIDE 0.9 % IV BOLUS
1000.0000 mL | Freq: Once | INTRAVENOUS | Status: AC
  Administered 2022-12-21: 1000 mL via INTRAVENOUS

## 2022-12-21 MED ORDER — KCL-LACTATED RINGERS 20 MEQ/L IV SOLN
INTRAVENOUS | Status: DC
  Filled 2022-12-21: qty 1000

## 2022-12-21 MED ORDER — NICOTINE POLACRILEX 2 MG MT GUM: 2.0000 mg | CHEWING_GUM | OROMUCOSAL | Status: DC | PRN

## 2022-12-21 MED ORDER — POLYETHYLENE GLYCOL 3350 17 G PO PACK: 17.0000 g | PACK | Freq: Every day | ORAL | Status: DC | PRN

## 2022-12-21 MED ORDER — OCTREOTIDE LOAD VIA INFUSION
50.0000 ug | Freq: Once | INTRAVENOUS | Status: AC
  Administered 2022-12-21: 50 ug via INTRAVENOUS
  Filled 2022-12-21: qty 25

## 2022-12-21 MED ORDER — ONDANSETRON HCL 4 MG/2ML IJ SOLN
4.0000 mg | Freq: Once | INTRAMUSCULAR | Status: AC
  Administered 2022-12-21: 4 mg via INTRAVENOUS
  Filled 2022-12-21: qty 2

## 2022-12-21 MED ORDER — PANTOPRAZOLE 80MG IVPB - SIMPLE MED
80.0000 mg | Freq: Once | INTRAVENOUS | Status: AC
  Administered 2022-12-21: 80 mg via INTRAVENOUS
  Filled 2022-12-21: qty 80

## 2022-12-21 MED ORDER — POTASSIUM CHLORIDE 10 MEQ/100ML IV SOLN
10.0000 meq | INTRAVENOUS | Status: AC
  Administered 2022-12-22 (×2): 10 meq via INTRAVENOUS
  Filled 2022-12-21 (×2): qty 100

## 2022-12-21 MED ORDER — ACETAMINOPHEN 325 MG PO TABS: 650.0000 mg | ORAL_TABLET | Freq: Four times a day (QID) | ORAL | Status: DC | PRN

## 2022-12-21 MED ORDER — METHOCARBAMOL 500 MG PO TABS: 750.0000 mg | ORAL_TABLET | Freq: Four times a day (QID) | ORAL | Status: DC | PRN

## 2022-12-21 MED ORDER — SODIUM CHLORIDE 0.9% FLUSH
3.0000 mL | Freq: Two times a day (BID) | INTRAVENOUS | Status: DC
  Administered 2022-12-22 – 2022-12-23 (×3): 3 mL via INTRAVENOUS

## 2022-12-21 MED ORDER — SODIUM CHLORIDE 0.9 % IV SOLN
50.0000 ug/h | INTRAVENOUS | Status: DC
  Administered 2022-12-21: 50 ug/h via INTRAVENOUS
  Filled 2022-12-21 (×3): qty 1

## 2022-12-21 MED ORDER — IOHEXOL 300 MG/ML  SOLN
100.0000 mL | Freq: Once | INTRAMUSCULAR | Status: AC | PRN
  Administered 2022-12-21: 100 mL via INTRAVENOUS

## 2022-12-21 NOTE — Assessment & Plan Note (Signed)
Advised to quit given liver disease

## 2022-12-21 NOTE — Assessment & Plan Note (Addendum)
Acute upper GI bleeding with no further episodes here in the ER.  Patient is vitally stable thankfully.  Type and screen has been sent.  Ordered for pantoprazole as well as octreotide.  Patient received 1 L normal saline bolus. C/w LR infusion. Reheck cbc in AM.  Factors include smoking, alcohol use as well as ibuprofen use.  This was discussed with the patient, hold all.  Case has been discussed with GI by ER attending with plan for EGD in the morning. Add on APTT

## 2022-12-21 NOTE — H&P (Signed)
History and Physical    Patient: Ronnie Burton ZOX:[id number] DOB: 06/18/1960 DOA: 12/21/2022 DOS: the patient was seen and examined on 12/21/2022 PCP: Fleet Contras, MD  Patient coming from: Home  Chief Complaint:  Chief Complaint  Patient presents with   Abdominal Pain   HPI: Ronnie Burton is a 62 y.o. male with medical history significant of hepatitis C, questionable cirrhosis.  History of cholecystectomy done laparoscopically about 2 weeks ago.  Patient has been treated with ibuprofen since then.  Usual state of health until earlier this afternoon he reports an abrupt onset of vomiting about 8 episodes.  There has been ongoing diarrhea since earlier this morning with yellow liquid stools.  No black liquid stools.  There is no fever.  Patient has ongoing abdominal pain from the cholecystectomy, no change in the pain since then.  No chest pain no shortness of breath no presyncope or loss of consciousness.  No other site of bleeding.  Patient came to the ER for further evaluation, there have been no more episodes of bleeding in the ER.  Vitally stable.  Medical evaluation is sought patient is accompanied by his wife.   Review of Systems: As mentioned in the history of present illness. All other systems reviewed and are negative. Past Medical History:  Diagnosis Date   Bipolar 1 disorder (HCC)    Headache    deneis   Heart murmur    Hepatitis    hep C, treated   Hypertension    Myocardial infarction (HCC)    mild when 30   Schizophrenia (HCC)    Past Surgical History:  Procedure Laterality Date   CHOLECYSTECTOMY N/A 12/05/2022   Procedure: LAPAROSCOPIC CHOLECYSTECTOMY;  Surgeon: Quentin Ore, MD;  Location: [facility];  Service: General;  Laterality: N/A;   COLONOSCOPY     Social History:  reports that he has been smoking cigarettes. He has a 5 pack-year smoking history. He has never used smokeless tobacco. He reports current alcohol use of about 8.0 standard drinks of  alcohol per week. He reports that he does not use drugs. Advised the patient to quit smoking, patient reports he is working on it No Known Allergies  Family History  Problem Relation Age of Onset   Alcoholism Father     Prior to Admission medications   Medication Sig Start Date End Date Taking? Authorizing Provider  albuterol (VENTOLIN HFA) 108 (90 Base) MCG/ACT inhaler Inhale 1-2 puffs into the lungs every 6 (six) hours as needed for wheezing or shortness of breath. 01/08/22   Raspet, Noberto Retort, PA-C  amoxicillin-clavulanate (AUGMENTIN) 875-125 MG tablet Take 1 tablet by mouth every 12 (twelve) hours. Patient not taking: Reported on 11/20/2022 01/08/22   Raspet, Denny Peon K, PA-C  fluticasone (FLONASE) 50 MCG/ACT nasal spray Place 1 spray into both nostrils daily as needed for allergies or rhinitis.    [provider]  ibuprofen (ADVIL,MOTRIN) 600 MG tablet Take 1 tablet (600 mg total) by mouth every 6 (six) hours as needed. Patient not taking: Reported on 11/20/2022 08/02/16   Fayrene Helper, PA-C  methocarbamol (ROBAXIN-750) 750 MG tablet Take 1 tablet (750 mg total) by mouth every 6 (six) hours as needed for muscle spasms. 12/05/22   Stechschulte, Hyman Hopes, MD  oxyCODONE-acetaminophen (PERCOCET) 5-325 MG tablet Take 1 tablet by mouth every 4 (four) hours as needed for severe pain. 12/05/22 12/05/23  Stechschulte, Hyman Hopes, MD  tamsulosin (FLOMAX) 0.4 MG CAPS capsule Take 0.4 mg by mouth.    [provider]  Vitamin D, Ergocalciferol, (DRISDOL) 1.25 MG (50000 UNIT) CAPS capsule Take 50,000 Units by mouth once a week. 10/24/22   [provider]    Physical Exam: Vitals:   12/21/22 1927 12/21/22 2100  BP: (!) 143/74 135/74  Pulse: 65 63  Resp: 16 16  Temp: 98.4 F (36.9 C)   SpO2: 98% 99%  Weight: 106.6 kg   Height: 6\' 5"  (1.956 m)    General: Patient is alert awake oriented x 3 does not appear to be distress, accompanied by his wife Respiratory exam: Bilateral air entry  vesicular Cardiovascular exam S1-S2 normal Abdomen all quadrants are soft , bowel sounds are normal.  There is no guarding or rigidity.  Patient reports epigastric area as well as umbilical area tenderness.  There are incision sites for the laparoscopy recently Extremities warm without edema Data Reviewed:  Labs on Admission:  Results for orders placed or performed during the hospital encounter of 12/21/22 (from the past 24 hour(s))  Urinalysis, Routine w reflex microscopic -Urine, Clean Catch     Status: Abnormal   Collection Time: 12/21/22  7:33 PM  Result Value Ref Range   Color, Urine STRAW (A) YELLOW   APPearance CLEAR CLEAR   Specific Gravity, Urine 1.002 (L) 1.005 - 1.030   pH 6.0 5.0 - 8.0   Glucose, UA NEGATIVE NEGATIVE mg/dL   Hgb urine dipstick NEGATIVE NEGATIVE   Bilirubin Urine NEGATIVE NEGATIVE   Ketones, ur NEGATIVE NEGATIVE mg/dL   Protein, ur NEGATIVE NEGATIVE mg/dL   Nitrite NEGATIVE NEGATIVE   Leukocytes,Ua NEGATIVE NEGATIVE  Lipase, blood     Status: None   Collection Time: 12/21/22  7:36 PM  Result Value Ref Range   Lipase 42 11 - 51 U/L  Comprehensive metabolic panel     Status: Abnormal   Collection Time: 12/21/22  7:36 PM  Result Value Ref Range   Sodium 133 (L) 135 - 145 mmol/L   Potassium 3.4 (L) 3.5 - 5.1 mmol/L   Chloride 104 98 - 111 mmol/L   CO2 18 (L) 22 - 32 mmol/L   Glucose, Bld 89 70 - 99 mg/dL   BUN 18 8 - 23 mg/dL   Creatinine, Ser 1.61 0.61 - 1.24 mg/dL   Calcium 8.8 (L) 8.9 - 10.3 mg/dL   Total Protein 7.9 6.5 - 8.1 g/dL   Albumin 3.8 3.5 - 5.0 g/dL   AST 47 (H) 15 - 41 U/L   ALT 52 (H) 0 - 44 U/L   Alkaline Phosphatase 64 38 - 126 U/L   Total Bilirubin 0.8 0.3 - 1.2 mg/dL   GFR, Estimated >09 >60 mL/min   Anion gap 11 5 - 15  CBC     Status: None   Collection Time: 12/21/22  7:36 PM  Result Value Ref Range   WBC 9.7 4.0 - 10.5 K/uL   RBC 4.41 4.22 - 5.81 MIL/uL   Hemoglobin 13.9 13.0 - 17.0 g/dL   HCT 45.4 09.8 - 11.9 %   MCV  93.2 80.0 - 100.0 fL   MCH 31.5 26.0 - 34.0 pg   MCHC 33.8 30.0 - 36.0 g/dL   RDW 14.7 82.9 - 56.2 %   Platelets 151 150 - 400 K/uL   nRBC 0.0 0.0 - 0.2 %  Ethanol     Status: Abnormal   Collection Time: 12/21/22  7:36 PM  Result Value Ref Range   Alcohol, Ethyl (B) 118 (H) <10 mg/dL  POC occult blood, ED  Status: None   Collection Time: 12/21/22  8:04 PM  Result Value Ref Range   Fecal Occult Bld NEGATIVE NEGATIVE  Protime-INR     Status: None   Collection Time: 12/21/22  8:28 PM  Result Value Ref Range   Prothrombin Time 13.7 11.4 - 15.2 seconds   INR 1.0 0.8 - 1.2  Type and screen [facility]     Status: None   Collection Time: 12/21/22  8:31 PM  Result Value Ref Range   ABO/RH(D) O POS    Antibody Screen NEG    Sample Expiration      12/24/2022,2359 Performed at [facility], [address], [place], [place] [address]   ABO/Rh     Status: None   Collection Time: 12/21/22  8:52 PM  Result Value Ref Range   ABO/RH(D)      O POS Performed at [facility], [address] [place], [place] [address]    Basic Metabolic Panel: Recent Labs  Lab 12/21/22 1936  NA 133*  K 3.4*  CL 104  CO2 18*  GLUCOSE 89  BUN 18  CREATININE 1.23  CALCIUM 8.8*   Liver Function Tests: Recent Labs  Lab 12/21/22 1936  AST 47*  ALT 52*  ALKPHOS 64  BILITOT 0.8  PROT 7.9  ALBUMIN 3.8   Recent Labs  Lab 12/21/22 1936  LIPASE 42   No results for input(s): "AMMONIA" in the last 168 hours. CBC: Recent Labs  Lab 12/21/22 1936  WBC 9.7  HGB 13.9  HCT 41.1  MCV 93.2  PLT 151   Cardiac Enzymes: No results for input(s): "CKTOTAL", "CKMB", "CKMBINDEX", "TROPONINIHS" in the last 168 hours.  BNP (last 3 results) No results for input(s): "PROBNP" in the last 8760 hours. CBG: No results for input(s): "GLUCAP" in the last 168 hours.  Radiological Exams on Admission:  CT ABDOMEN PELVIS W CONTRAST  Result Date:  12/21/2022 CLINICAL DATA:  Postop abdominal pain following cholecystectomy 2 and half weeks ago. Diarrhea and vomiting with blood in vomit. EXAM: CT ABDOMEN AND PELVIS WITH CONTRAST TECHNIQUE: Multidetector CT imaging of the abdomen and pelvis was performed using the standard protocol following bolus administration of intravenous contrast. RADIATION DOSE REDUCTION: This exam was performed according to the departmental dose-optimization program which includes automated exposure control, adjustment of the mA and/or kV according to patient size and/or use of iterative reconstruction technique. CONTRAST:  OMNIPAQUE IOHEXOL 300 MG/ML  SOLN COMPARISON:  09/17/2022. FINDINGS: Lower chest: No acute abnormality. Hepatobiliary: No focal liver abnormality is seen. Fatty infiltration of the liver is noted. Status post cholecystectomy with minimal residual fat stranding in the surgical bed. No abscess is seen. No biliary dilatation. Pancreas: Unremarkable. No pancreatic ductal dilatation or surrounding inflammatory changes. Spleen: Normal in size without focal abnormality. Adrenals/Urinary Tract: The adrenal glands are within normal limits. The kidneys enhance symmetrically. No renal or ureteral calculus or hydronephrosis. The bladder is unremarkable. Stomach/Bowel: Stomach is within normal limits. Appendix appears normal. No evidence of bowel wall thickening, distention, or inflammatory changes. No free air or pneumatosis. Vascular/Lymphatic: Aortic atherosclerosis. No enlarged abdominal or pelvic lymph nodes. Reproductive: Prostate is unremarkable. Other: No abdominopelvic ascites. Calcifications are noted in the mesentery in the left lower quadrant suggesting fat necrosis. A small fat containing umbilical hernia is present. Musculoskeletal: No acute osseous abnormality. IMPRESSION: 1. Status post cholecystectomy with minimal residual fat stranding in the gallbladder fossa. No abscess is seen. 2. Hepatic steatosis.  Electronically Signed   By: Thornell Sartorius M.D.   On: 12/21/2022 21:00   DG Chest Portable 1 View  Result Date: 12/21/2022 CLINICAL DATA:  Vomiting blood.  Recent cholecystectomy. EXAM: PORTABLE CHEST 1 VIEW COMPARISON:  01/08/2022 FINDINGS: The cardiomediastinal contours are normal. No evidence of pneumomediastinum. The lungs are clear. Pulmonary vasculature is normal. No consolidation, pleural effusion, or pneumothorax. No acute osseous abnormalities are seen. IMPRESSION: No acute chest findings. Electronically Signed   By: Narda Rutherford M.D.   On: 12/21/2022 20:33    EKG: Independently reviewed. NSR   Assessment and Plan: * Upper GI bleeding Acute upper GI bleeding with no further episodes here in the ER.  Patient is vitally stable thankfully.  Type and screen has been sent.  Ordered for pantoprazole as well as octreotide.  Patient received 1 L normal saline bolus. C/w LR infusion. Reheck cbc in AM.  Factors include smoking, alcohol use as well as ibuprofen use.  This was discussed with the patient, hold all.  Case has been discussed with GI by ER attending with plan for EGD in the morning. Add on APTT  Alcohol abuse Advised to quit given liver disease  Cigarette smoker Routine replacement therapy, advised to quit smoking.  Hepatitis C Outpatient follow-up  Patient reports to me that the only medications he is taking at home are methocarbamol as well as ibuprofen.    Advance Care Planning:   Code Status: Prior full code  Consults:  GI consult by ER attending to dr. Lorenso Quarry.  Family Communication: wife at bedside.  Severity of Illness: The appropriate patient status for this patient is INPATIENT. Inpatient status is judged to be reasonable and necessary in order to provide the required intensity of service to ensure the patient's safety. The patient's presenting symptoms, physical exam findings, and initial radiographic and laboratory data in the context of their chronic  comorbidities is felt to place them at high risk for further clinical deterioration. Furthermore, it is not anticipated that the patient will be medically stable for discharge from the hospital within 2 midnights of admission.   * I certify that at the point of admission it is my clinical judgment that the patient will require inpatient hospital care spanning beyond 2 midnights from the point of admission due to high intensity of service, high risk for further deterioration and high frequency of surveillance required.*  Author: Nolberto Hanlon, MD 12/21/2022 10:59 PM  For on call review [email or web address].

## 2022-12-21 NOTE — ED Triage Notes (Signed)
Pt presents via EMS c/o upper abd pain starting today. Reports had cholecystectomy x2.5 weeks ago. Reports has had diarrhea since having cholecystectomy. Reports vomiting x8 with blood in vomit. Reports large amount of blood in vomit. A&O x4.

## 2022-12-21 NOTE — Assessment & Plan Note (Signed)
Routine replacement therapy, advised to quit smoking.

## 2022-12-21 NOTE — Assessment & Plan Note (Signed)
-   Outpatient follow-up °

## 2022-12-21 NOTE — ED Provider Notes (Signed)
[place] EMERGENCY DEPARTMENT AT [facility] Provider Note   CSN: [id number] Arrival date & time: 12/21/22  1919     History  Chief Complaint  Patient presents with   Abdominal Pain    Ronnie Burton is a 62 y.o. male.  Patient here after several episodes of hematemesis.  History of hepatitis C, alcohol abuse, gallbladder removal few weeks ago.  He has been drinking alcohol tonight.  He had about 8 episodes of hematemesis.  Sounds like small-volume.  No major blood clots.  Not on any blood thinners.  He is having some upper abdominal pain.  He noticed some dark stool maybe yesterday.  Has had a scope in the past but no GI bleed in the past.  Denies any history of varices.  Denies any chest pain shortness of breath weakness numbness tingling.  The history is provided by the patient.       Home Medications Prior to Admission medications   Medication Sig Start Date End Date Taking? Authorizing Provider  albuterol (VENTOLIN HFA) 108 (90 Base) MCG/ACT inhaler Inhale 1-2 puffs into the lungs every 6 (six) hours as needed for wheezing or shortness of breath. 01/08/22   Raspet, Noberto Retort, PA-C  amoxicillin-clavulanate (AUGMENTIN) 875-125 MG tablet Take 1 tablet by mouth every 12 (twelve) hours. Patient not taking: Reported on 11/20/2022 01/08/22   Raspet, Denny Peon K, PA-C  fluticasone (FLONASE) 50 MCG/ACT nasal spray Place 1 spray into both nostrils daily as needed for allergies or rhinitis.    [provider]  ibuprofen (ADVIL,MOTRIN) 600 MG tablet Take 1 tablet (600 mg total) by mouth every 6 (six) hours as needed. Patient not taking: Reported on 11/20/2022 08/02/16   Fayrene Helper, PA-C  methocarbamol (ROBAXIN-750) 750 MG tablet Take 1 tablet (750 mg total) by mouth every 6 (six) hours as needed for muscle spasms. 12/05/22   Stechschulte, Hyman Hopes, MD  oxyCODONE-acetaminophen (PERCOCET) 5-325 MG tablet Take 1 tablet by mouth every 4 (four) hours as needed for severe pain. 12/05/22  12/05/23  Stechschulte, Hyman Hopes, MD  tamsulosin (FLOMAX) 0.4 MG CAPS capsule Take 0.4 mg by mouth.    [provider]  Vitamin D, Ergocalciferol, (DRISDOL) 1.25 MG (50000 UNIT) CAPS capsule Take 50,000 Units by mouth once a week. 10/24/22   [provider]      Allergies    Patient has no known allergies.    Review of Systems   Review of Systems  Physical Exam Updated Vital Signs BP 135/74   Pulse 63   Temp 98.4 F (36.9 C)   Resp 16   Ht 6\' 5"  (1.956 m)   Wt 106.6 kg   SpO2 99%   BMI 27.87 kg/m  Physical Exam Vitals and nursing note reviewed.  Constitutional:      General: He is not in acute distress.    Appearance: He is well-developed.  HENT:     Head: Normocephalic and atraumatic.  Eyes:     Extraocular Movements: Extraocular movements intact.     Conjunctiva/sclera: Conjunctivae normal.     Pupils: Pupils are equal, round, and reactive to light.  Cardiovascular:     Rate and Rhythm: Normal rate and regular rhythm.     Heart sounds: No murmur heard. Pulmonary:     Effort: Pulmonary effort is normal. No respiratory distress.     Breath sounds: Normal breath sounds.  Abdominal:     Palpations: Abdomen is soft.     Tenderness: There is abdominal tenderness  in the epigastric area.  Genitourinary:    Rectum: Guaiac result negative.  Musculoskeletal:        General: No swelling.     Cervical back: Neck supple.  Skin:    General: Skin is warm and dry.     Capillary Refill: Capillary refill takes less than 2 seconds.  Neurological:     Mental Status: He is alert.  Psychiatric:        Mood and Affect: Mood normal.     ED Results / Procedures / Treatments   Labs (all labs ordered are listed, but only abnormal results are displayed) Labs Reviewed  COMPREHENSIVE METABOLIC PANEL - Abnormal; Notable for the following components:      Result Value   Sodium 133 (*)    Potassium 3.4 (*)    CO2 18 (*)    Calcium 8.8 (*)    AST 47 (*)    ALT 52 (*)     All other components within normal limits  URINALYSIS, ROUTINE W REFLEX MICROSCOPIC - Abnormal; Notable for the following components:   Color, Urine STRAW (*)    Specific Gravity, Urine 1.002 (*)    All other components within normal limits  ETHANOL - Abnormal; Notable for the following components:   Alcohol, Ethyl (B) 118 (*)    All other components within normal limits  LIPASE, BLOOD  CBC  PROTIME-INR  POC OCCULT BLOOD, ED  TYPE AND SCREEN  ABO/RH    EKG EKG Interpretation Date/Time:  Friday December 21 2022 19:35:38 EDT Ventricular Rate:  67 PR Interval:  205 QRS Duration:  96 QT Interval:  440 QTC Calculation: 465 R Axis:   74  Text Interpretation: Sinus rhythm Borderline repolarization abnormality Confirmed by Virgina Norfolk [phone]) on 12/21/2022 7:45:02 PM  Radiology CT ABDOMEN PELVIS W CONTRAST  Result Date: 12/21/2022 CLINICAL DATA:  Postop abdominal pain following cholecystectomy 2 and half weeks ago. Diarrhea and vomiting with blood in vomit. EXAM: CT ABDOMEN AND PELVIS WITH CONTRAST TECHNIQUE: Multidetector CT imaging of the abdomen and pelvis was performed using the standard protocol following bolus administration of intravenous contrast. RADIATION DOSE REDUCTION: This exam was performed according to the departmental dose-optimization program which includes automated exposure control, adjustment of the mA and/or kV according to patient size and/or use of iterative reconstruction technique. CONTRAST:  OMNIPAQUE IOHEXOL 300 MG/ML  SOLN COMPARISON:  09/17/2022. FINDINGS: Lower chest: No acute abnormality. Hepatobiliary: No focal liver abnormality is seen. Fatty infiltration of the liver is noted. Status post cholecystectomy with minimal residual fat stranding in the surgical bed. No abscess is seen. No biliary dilatation. Pancreas: Unremarkable. No pancreatic ductal dilatation or surrounding inflammatory changes. Spleen: Normal in size without focal abnormality.  Adrenals/Urinary Tract: The adrenal glands are within normal limits. The kidneys enhance symmetrically. No renal or ureteral calculus or hydronephrosis. The bladder is unremarkable. Stomach/Bowel: Stomach is within normal limits. Appendix appears normal. No evidence of bowel wall thickening, distention, or inflammatory changes. No free air or pneumatosis. Vascular/Lymphatic: Aortic atherosclerosis. No enlarged abdominal or pelvic lymph nodes. Reproductive: Prostate is unremarkable. Other: No abdominopelvic ascites. Calcifications are noted in the mesentery in the left lower quadrant suggesting fat necrosis. A small fat containing umbilical hernia is present. Musculoskeletal: No acute osseous abnormality. IMPRESSION: 1. Status post cholecystectomy with minimal residual fat stranding in the gallbladder fossa. No abscess is seen. 2. Hepatic steatosis. Electronically Signed   By: Thornell Sartorius M.D.   On: 12/21/2022 21:00   DG Chest Portable  1 View  Result Date: 12/21/2022 CLINICAL DATA:  Vomiting blood.  Recent cholecystectomy. EXAM: PORTABLE CHEST 1 VIEW COMPARISON:  01/08/2022 FINDINGS: The cardiomediastinal contours are normal. No evidence of pneumomediastinum. The lungs are clear. Pulmonary vasculature is normal. No consolidation, pleural effusion, or pneumothorax. No acute osseous abnormalities are seen. IMPRESSION: No acute chest findings. Electronically Signed   By: Narda Rutherford M.D.   On: 12/21/2022 20:33    Procedures Procedures    Medications Ordered in ED Medications  pantoprozole (PROTONIX) 80 mg /NS 100 mL infusion (has no administration in time range)  pantoprazole (PROTONIX) injection 40 mg (has no administration in time range)  octreotide (SANDOSTATIN) 2 mcg/mL load via infusion 50 mcg (has no administration in time range)    And  octreotide (SANDOSTATIN) 500 mcg in sodium chloride 0.9 % 250 mL (2 mcg/mL) infusion (50 mcg/hr Intravenous New Bag/Given 12/21/22 2135)  nicotine  polacrilex (NICORETTE) gum 2 mg (has no administration in time range)  sodium chloride 0.9 % bolus 1,000 mL (0 mLs Intravenous Stopped 12/21/22 2221)  ondansetron (ZOFRAN) injection 4 mg (4 mg Intravenous Given 12/21/22 2026)  pantoprazole (PROTONIX) 80 mg /NS 100 mL IVPB (0 mg Intravenous Stopped 12/21/22 2221)  iohexol (OMNIPAQUE) 300 MG/ML solution 100 mL (100 mLs Intravenous Contrast Given 12/21/22 2032)    ED Course/ Medical Decision Making/ A&P                                 Medical Decision Making Amount and/or Complexity of Data Reviewed Labs: ordered. Radiology: ordered.  Risk Prescription drug management. Decision regarding hospitalization.   Romeo Gabhart is here hematemesis.  History of hep C, alcohol abuse, recent cholecystectomy.  Normal vitals.  No fever.  Well-appearing.  Differential diagnosis likely upper GI bleed/gastritis/less likely varices.  Seems less likely to be postop complication.  Will get CBC, CMP, lipase, urinalysis, Hemoccult test, CT scan abdomen and pelvis.  Stool is grossly brown.  Does not Solik has had large-volume hematemesis but has had multiple episodes.  He is been drinking alcohol tonight.  Per my review and interpretation of labs no significant anemia or electrolyte abnormality or kidney injury or leukocytosis.  Hemoglobin is 13.9.  Alcohol is 118.  CT scans unremarkable per radiology report.  I talked with Dr. Lorenso Quarry with gastroenterology who does recommend continuing IV Protonix infusion and as well as IV octreotide which I earlier initiated.  CT shows hepatic steatosis but no ascites.  Overall patient to be headed to medicine for further care.  Hemodynamically stable throughout my care.  This chart was dictated using voice recognition software.  Despite best efforts to proofread,  errors can occur which can change the documentation meaning.         Final Clinical Impression(s) / ED Diagnoses Final diagnoses:  Hematemesis, unspecified  whether nausea present    Rx / DC Orders ED Discharge Orders     None         Virgina Norfolk, DO 12/21/22 2301

## 2022-12-22 ENCOUNTER — Encounter (HOSPITAL_COMMUNITY): Payer: Self-pay | Admitting: Internal Medicine

## 2022-12-22 ENCOUNTER — Encounter (HOSPITAL_COMMUNITY)
Admission: EM | Disposition: A | Discharge status: Home / Self Care | Payer: Self-pay | Source: Home / Self Care | Attending: Family Medicine

## 2022-12-22 ENCOUNTER — Inpatient Hospital Stay (HOSPITAL_COMMUNITY): Payer: MEDICAID | Admitting: Anesthesiology

## 2022-12-22 DIAGNOSIS — K2970 Gastritis, unspecified, without bleeding: Secondary | ICD-10-CM | POA: Diagnosis not present

## 2022-12-22 DIAGNOSIS — K209 Esophagitis, unspecified without bleeding: Secondary | ICD-10-CM | POA: Diagnosis not present

## 2022-12-22 DIAGNOSIS — F17210 Nicotine dependence, cigarettes, uncomplicated: Secondary | ICD-10-CM | POA: Diagnosis not present

## 2022-12-22 DIAGNOSIS — I10 Essential (primary) hypertension: Secondary | ICD-10-CM

## 2022-12-22 DIAGNOSIS — K922 Gastrointestinal hemorrhage, unspecified: Secondary | ICD-10-CM | POA: Diagnosis not present

## 2022-12-22 HISTORY — PX: BIOPSY: SHX5522

## 2022-12-22 HISTORY — PX: ESOPHAGOGASTRODUODENOSCOPY: SHX5428

## 2022-12-22 LAB — BASIC METABOLIC PANEL
Anion gap: 10 (ref 5–15)
BUN: 16 mg/dL (ref 8–23)
CO2: 20 mmol/L — ABNORMAL LOW (ref 22–32)
Calcium: 9 mg/dL (ref 8.9–10.3)
Chloride: 106 mmol/L (ref 98–111)
Creatinine, Ser: 1.38 mg/dL — ABNORMAL HIGH (ref 0.61–1.24)
GFR, Estimated: 58 mL/min — ABNORMAL LOW (ref >60)
Glucose, Bld: 164 mg/dL — ABNORMAL HIGH (ref 70–99)
Potassium: 4.3 mmol/L (ref 3.5–5.1)
Sodium: 136 mmol/L (ref 135–145)

## 2022-12-22 LAB — CBC
HCT: 41 % (ref 39.0–52.0)
Hemoglobin: 14.1 g/dL (ref 13.0–17.0)
MCH: 32.2 pg (ref 26.0–34.0)
MCHC: 34.4 g/dL (ref 30.0–36.0)
MCV: 93.6 fL (ref 80.0–100.0)
Platelets: 143 10*3/uL — ABNORMAL LOW (ref 150–400)
RBC: 4.38 MIL/uL (ref 4.22–5.81)
RDW: 12.1 % (ref 11.5–15.5)
WBC: 9 10*3/uL (ref 4.0–10.5)
nRBC: 0 % (ref 0.0–0.2)

## 2022-12-22 LAB — APTT: aPTT: 34 seconds (ref 24–36)

## 2022-12-22 SURGERY — EGD (ESOPHAGOGASTRODUODENOSCOPY)
Anesthesia: General

## 2022-12-22 MED ORDER — PROPOFOL 10 MG/ML IV BOLUS
INTRAVENOUS | Status: AC
  Filled 2022-12-22: qty 20

## 2022-12-22 MED ORDER — PROPOFOL 1000 MG/100ML IV EMUL
INTRAVENOUS | Status: AC
  Filled 2022-12-22: qty 100

## 2022-12-22 MED ORDER — METOCLOPRAMIDE HCL 5 MG/ML IJ SOLN
10.0000 mg | Freq: Once | INTRAMUSCULAR | Status: AC
  Administered 2022-12-22: 10 mg via INTRAVENOUS
  Filled 2022-12-22: qty 2

## 2022-12-22 MED ORDER — PROPOFOL 10 MG/ML IV BOLUS
INTRAVENOUS | Status: DC | PRN
  Administered 2022-12-22: 100 mg via INTRAVENOUS
  Administered 2022-12-22 (×2): 30 mg via INTRAVENOUS

## 2022-12-22 MED ORDER — PROPOFOL 500 MG/50ML IV EMUL
INTRAVENOUS | Status: DC | PRN
  Administered 2022-12-22: 150 ug/kg/min via INTRAVENOUS

## 2022-12-22 MED ORDER — LACTATED RINGERS IV SOLN
INTRAVENOUS | Status: AC | PRN
Start: 2022-12-22 — End: 2022-12-22
  Administered 2022-12-22: 1000 mL via INTRAVENOUS

## 2022-12-22 MED ORDER — POTASSIUM CHLORIDE 2 MEQ/ML IV SOLN
INTRAVENOUS | Status: DC
  Filled 2022-12-22 (×4): qty 1000

## 2022-12-22 MED ORDER — LIDOCAINE HCL (CARDIAC) PF 100 MG/5ML IV SOSY
PREFILLED_SYRINGE | INTRAVENOUS | Status: DC | PRN
  Administered 2022-12-22: 80 mg via INTRAVENOUS

## 2022-12-22 MED ORDER — SODIUM CHLORIDE 0.9 % IV SOLN: INTRAVENOUS | Status: DC

## 2022-12-22 NOTE — ED Notes (Signed)
ED TO INPATIENT HANDOFF REPORT  ED Nurse Name and Phone #: Grae Cannata Woodroe Chen EMTP   S Name/Age/Gender Ronnie Burton 62 y.o. male Room/Bed: WA02/WA02  Code Status   Code Status: Full Code  Home/SNF/Other   Patient oriented to: self, place, time, and situation Is this baseline? Yes   Triage Complete: Triage complete  Chief Complaint GI bleeding [K92.2]  Triage Note Pt presents via EMS c/o upper abd pain starting today. Reports had cholecystectomy x2.5 weeks ago. Reports has had diarrhea since having cholecystectomy. Reports vomiting x8 with blood in vomit. Reports large amount of blood in vomit. A&O x4.    Allergies No Known Allergies  Level of Care/Admitting Diagnosis ED Disposition     ED Disposition  Admit   Condition  --   Comment  Hospital Area: [facility] [[id number]]  Level of Care: Progressive [102]  Admit to Progressive based on following criteria: GI, ENDOCRINE disease patients with GI bleeding, acute liver failure or pancreatitis, stable with diabetic ketoacidosis or thyrotoxicosis (hypothyroid) state.  May admit patient to [facility] or [facility] if equivalent level of care is available:: No  Covid Evaluation: Asymptomatic - no recent exposure (last 10 days) testing not required  Diagnosis: GI bleeding [409811]  Admitting Physician: Nolberto Hanlon [[id number]]  Attending Physician: Nolberto Hanlon [[id number]]  Certification:: I certify this patient will need inpatient services for at least 2 midnights  Expected Medical Readiness: 12/23/2022          B Medical/Surgery History Past Medical History:  Diagnosis Date   Bipolar 1 disorder (HCC)    Headache    deneis   Heart murmur    Hepatitis    hep C, treated   Hypertension    Myocardial infarction (HCC)    mild when 30   Schizophrenia (HCC)    Past Surgical History:  Procedure Laterality Date   CHOLECYSTECTOMY N/A 12/05/2022   Procedure: LAPAROSCOPIC CHOLECYSTECTOMY;  Surgeon:  Quentin Ore, MD;  Location: [facility];  Service: General;  Laterality: N/A;   COLONOSCOPY       A IV Location/Drains/Wounds Patient Lines/Drains/Airways Status     Active Line/Drains/Airways     Name Placement date Placement time Site Days   Peripheral IV 12/21/22 20 G Distal;Left;Posterior Forearm 12/21/22  1924  Forearm  1   Peripheral IV 12/22/22 20 G Anterior;Left Antecubital 12/22/22  0700  Antecubital  less than 1   Incision - 4 Ports Abdomen 1: Right 2: Mid 3: Umbilicus 4: Upper 12/05/22  0900  -- 17            Intake/Output Last 24 hours No intake or output data in the 24 hours ending 12/22/22 1135  Labs/Imaging Results for orders placed or performed during the hospital encounter of 12/21/22 (from the past 48 hour(s))  Urinalysis, Routine w reflex microscopic -Urine, Clean Catch     Status: Abnormal   Collection Time: 12/21/22  7:33 PM  Result Value Ref Range   Color, Urine STRAW (A) YELLOW   APPearance CLEAR CLEAR   Specific Gravity, Urine 1.002 (L) 1.005 - 1.030   pH 6.0 5.0 - 8.0   Glucose, UA NEGATIVE NEGATIVE mg/dL   Hgb urine dipstick NEGATIVE NEGATIVE   Bilirubin Urine NEGATIVE NEGATIVE   Ketones, ur NEGATIVE NEGATIVE mg/dL   Protein, ur NEGATIVE NEGATIVE mg/dL   Nitrite NEGATIVE NEGATIVE   Leukocytes,Ua NEGATIVE NEGATIVE    Comment: Performed at [facility], [address] [place], [place] [address]  Lipase, blood     Status: None   Collection Time: 12/21/22  7:36 PM  Result Value Ref Range   Lipase 42 11 - 51 U/L    Comment: Performed at [facility], [address] [place], [place] [address]  Comprehensive metabolic panel     Status: Abnormal   Collection Time: 12/21/22  7:36 PM  Result Value Ref Range   Sodium 133 (L) 135 - 145 mmol/L   Potassium 3.4 (L) 3.5 - 5.1 mmol/L   Chloride 104 98 - 111 mmol/L   CO2 18 (L) 22 - 32 mmol/L   Glucose, Bld 89 70 - 99 mg/dL    Comment: Glucose reference range  applies only to samples taken after fasting for at least 8 hours.   BUN 18 8 - 23 mg/dL   Creatinine, Ser 6.04 0.61 - 1.24 mg/dL   Calcium 8.8 (L) 8.9 - 10.3 mg/dL   Total Protein 7.9 6.5 - 8.1 g/dL   Albumin 3.8 3.5 - 5.0 g/dL   AST 47 (H) 15 - 41 U/L   ALT 52 (H) 0 - 44 U/L   Alkaline Phosphatase 64 38 - 126 U/L   Total Bilirubin 0.8 0.3 - 1.2 mg/dL   GFR, Estimated >54 >09 mL/min    Comment: (NOTE) Calculated using the CKD-EPI Creatinine Equation (2021)    Anion gap 11 5 - 15    Comment: Performed at [facility], [address], [place], [place] [address]  CBC     Status: None   Collection Time: 12/21/22  7:36 PM  Result Value Ref Range   WBC 9.7 4.0 - 10.5 K/uL   RBC 4.41 4.22 - 5.81 MIL/uL   Hemoglobin 13.9 13.0 - 17.0 g/dL   HCT 47.8 29.5 - 62.1 %   MCV 93.2 80.0 - 100.0 fL   MCH 31.5 26.0 - 34.0 pg   MCHC 33.8 30.0 - 36.0 g/dL   RDW 30.8 65.7 - 84.6 %   Platelets 151 150 - 400 K/uL   nRBC 0.0 0.0 - 0.2 %    Comment: Performed at [facility], [address], [place], [place] [address]  Ethanol     Status: Abnormal   Collection Time: 12/21/22  7:36 PM  Result Value Ref Range   Alcohol, Ethyl (B) 118 (H) <10 mg/dL    Comment: (NOTE) Lowest detectable limit for serum alcohol is 10 mg/dL.  For medical purposes only. Performed at [facility], [address], [place], [place] [address]   POC occult blood, ED     Status: None   Collection Time: 12/21/22  8:04 PM  Result Value Ref Range   Fecal Occult Bld NEGATIVE NEGATIVE  Protime-INR     Status: None   Collection Time: 12/21/22  8:28 PM  Result Value Ref Range   Prothrombin Time 13.7 11.4 - 15.2 seconds   INR 1.0 0.8 - 1.2    Comment: (NOTE) INR goal varies based on device and disease states. Performed at [facility], [address] [place], [place] [address]   Type and screen [facility]     Status: None    Collection Time: 12/21/22  8:31 PM  Result Value Ref Range   ABO/RH(D) O POS    Antibody Screen NEG    Sample Expiration      12/24/2022,2359 Performed at [facility], [address], [place], [place] [address]   ABO/Rh  Status: None   Collection Time: 12/21/22  8:52 PM  Result Value Ref Range   ABO/RH(D)      O POS Performed at [facility], [address] [place], [place] [address]   CBC     Status: Abnormal   Collection Time: 12/22/22  5:56 AM  Result Value Ref Range   WBC 9.0 4.0 - 10.5 K/uL   RBC 4.38 4.22 - 5.81 MIL/uL   Hemoglobin 14.1 13.0 - 17.0 g/dL   HCT 53.6 64.4 - 03.4 %   MCV 93.6 80.0 - 100.0 fL   MCH 32.2 26.0 - 34.0 pg   MCHC 34.4 30.0 - 36.0 g/dL   RDW 74.2 59.5 - 63.8 %   Platelets 143 (L) 150 - 400 K/uL    Comment: CONSISTENT WITH PREVIOUS RESULT REPEATED TO VERIFY    nRBC 0.0 0.0 - 0.2 %    Comment: Performed at [facility], [address], [place], [place] [address]  Basic metabolic panel     Status: Abnormal   Collection Time: 12/22/22  5:56 AM  Result Value Ref Range   Sodium 136 135 - 145 mmol/L   Potassium 4.3 3.5 - 5.1 mmol/L   Chloride 106 98 - 111 mmol/L   CO2 20 (L) 22 - 32 mmol/L   Glucose, Bld 164 (H) 70 - 99 mg/dL    Comment: Glucose reference range applies only to samples taken after fasting for at least 8 hours.   BUN 16 8 - 23 mg/dL   Creatinine, Ser 3.29 (H) 0.61 - 1.24 mg/dL   Calcium 9.0 8.9 - 51.8 mg/dL   GFR, Estimated 58 (L) >60 mL/min    Comment: (NOTE) Calculated using the CKD-EPI Creatinine Equation (2021)    Anion gap 10 5 - 15    Comment: Performed at [facility], [address] [place], [place] [address]   CT ABDOMEN PELVIS W CONTRAST  Result Date: 12/21/2022 CLINICAL DATA:  Postop abdominal pain following cholecystectomy 2 and half weeks ago. Diarrhea and vomiting with blood in vomit. EXAM: CT ABDOMEN AND PELVIS WITH CONTRAST  TECHNIQUE: Multidetector CT imaging of the abdomen and pelvis was performed using the standard protocol following bolus administration of intravenous contrast. RADIATION DOSE REDUCTION: This exam was performed according to the departmental dose-optimization program which includes automated exposure control, adjustment of the mA and/or kV according to patient size and/or use of iterative reconstruction technique. CONTRAST:  OMNIPAQUE IOHEXOL 300 MG/ML  SOLN COMPARISON:  09/17/2022. FINDINGS: Lower chest: No acute abnormality. Hepatobiliary: No focal liver abnormality is seen. Fatty infiltration of the liver is noted. Status post cholecystectomy with minimal residual fat stranding in the surgical bed. No abscess is seen. No biliary dilatation. Pancreas: Unremarkable. No pancreatic ductal dilatation or surrounding inflammatory changes. Spleen: Normal in size without focal abnormality. Adrenals/Urinary Tract: The adrenal glands are within normal limits. The kidneys enhance symmetrically. No renal or ureteral calculus or hydronephrosis. The bladder is unremarkable. Stomach/Bowel: Stomach is within normal limits. Appendix appears normal. No evidence of bowel wall thickening, distention, or inflammatory changes. No free air or pneumatosis. Vascular/Lymphatic: Aortic atherosclerosis. No enlarged abdominal or pelvic lymph nodes. Reproductive: Prostate is unremarkable. Other: No abdominopelvic ascites. Calcifications are noted in the mesentery in the left lower quadrant suggesting fat necrosis. A small fat containing umbilical hernia is present. Musculoskeletal: No acute osseous abnormality. IMPRESSION: 1. Status post cholecystectomy with minimal residual fat stranding in the gallbladder fossa. No abscess is seen.  2. Hepatic steatosis. Electronically Signed   By: Thornell Sartorius M.D.   On: 12/21/2022 21:00   DG Chest Portable 1 View  Result Date: 12/21/2022 CLINICAL DATA:  Vomiting blood.  Recent cholecystectomy.  EXAM: PORTABLE CHEST 1 VIEW COMPARISON:  01/08/2022 FINDINGS: The cardiomediastinal contours are normal. No evidence of pneumomediastinum. The lungs are clear. Pulmonary vasculature is normal. No consolidation, pleural effusion, or pneumothorax. No acute osseous abnormalities are seen. IMPRESSION: No acute chest findings. Electronically Signed   By: Narda Rutherford M.D.   On: 12/21/2022 20:33    Pending Labs Unresulted Labs (From admission, onward)     Start     Ordered   12/22/22 1030  HIV Antibody (routine testing w rflx)  Once,   R        12/22/22 1030   12/22/22 1030  APTT  Once,   AD        12/22/22 1030            Vitals/Pain Today's Vitals   12/22/22 0629 12/22/22 0800 12/22/22 1100 12/22/22 1108  BP:  134/69 (!) 154/89   Pulse:  (!) 52 (!) 48   Resp:  17 14   Temp: 98.2 F (36.8 C)   98.4 F (36.9 C)  TempSrc: Oral   Oral  SpO2:  94% 99%   Weight:      Height:      PainSc:        Isolation Precautions No active isolations  Medications Medications  pantoprozole (PROTONIX) 80 mg /NS 100 mL infusion (8 mg/hr Intravenous New Bag/Given 12/22/22 0748)  pantoprazole (PROTONIX) injection 40 mg (has no administration in time range)  octreotide (SANDOSTATIN) 2 mcg/mL load via infusion 50 mcg (50 mcg Intravenous Bolus from Bag 12/21/22 2306)    And  octreotide (SANDOSTATIN) 500 mcg in sodium chloride 0.9 % 250 mL (2 mcg/mL) infusion (0 mcg/hr Intravenous Stopped 12/22/22 0916)  nicotine polacrilex (NICORETTE) gum 2 mg (has no administration in time range)  methocarbamol (ROBAXIN) tablet 750 mg (has no administration in time range)  acetaminophen (TYLENOL) tablet 650 mg (has no administration in time range)    Or  acetaminophen (TYLENOL) suppository 650 mg (has no administration in time range)  polyethylene glycol (MIRALAX / GLYCOLAX) packet 17 g (has no administration in time range)  sodium chloride flush (NS) 0.9 % injection 3 mL (3 mLs Intravenous Not Given 12/22/22 0918)   lactated ringers 1,000 mL with potassium chloride 20 mEq infusion ( Intravenous New Bag/Given 12/22/22 0917)  sodium chloride 0.9 % bolus 1,000 mL (0 mLs Intravenous Stopped 12/21/22 2221)  ondansetron (ZOFRAN) injection 4 mg (4 mg Intravenous Given 12/21/22 2026)  pantoprazole (PROTONIX) 80 mg /NS 100 mL IVPB (0 mg Intravenous Stopped 12/21/22 2221)  iohexol (OMNIPAQUE) 300 MG/ML solution 100 mL (100 mLs Intravenous Contrast Given 12/21/22 2032)  potassium chloride 10 mEq in 100 mL IVPB (0 mEq Intravenous Stopped 12/22/22 0431)    Mobility walks     Focused Assessments     R Recommendations: See Admitting Provider Note  Report given to:   Additional Notes:

## 2022-12-22 NOTE — Op Note (Signed)
[facility] Patient Name: Ronnie Burton Procedure Date: 12/22/2022 MRN: [id number] Attending MD: Liliane Shi DO, DO, [id number] Date of Birth: 04/13/1961 CSN: [id number] Age: 62 Admit Type: Inpatient Procedure:                Upper GI endoscopy Indications:              Hematemesis Providers:                Liliane Shi DO, DO, Eliberto Ivory, RN, Rozetta Nunnery, Technician Referring MD:              Medicines:                See the Anesthesia note for documentation of the                            administered medications Complications:            No immediate complications. Estimated Blood Loss:     Estimated blood loss was minimal. Procedure:                Pre-Anesthesia Assessment:                           - ASA Grade Assessment: III - A patient with severe                            systemic disease.                           - The risks and benefits of the procedure and the                            sedation options and risks were discussed with the                            patient. All questions were answered and informed                            consent was obtained.                           After obtaining informed consent, the endoscope was                            passed under direct vision. Throughout the                            procedure, the patient's blood pressure, pulse, and                            oxygen saturations were monitored continuously. The                            [id number]-[id number] ([id number]) Olympus endoscope was introduced  through the mouth, and advanced to the second part                            of duodenum. The upper GI endoscopy was                            accomplished without difficulty. The patient                            tolerated the procedure well. Scope In: Scope Out: Findings:      LA Grade A (one or more mucosal breaks less than 5 mm, not extending        between tops of 2 mucosal folds) esophagitis with no bleeding was found       43 cm from the incisors.      A large amount of food (residue) was found in the gastric fundus, in the       gastric body and on the greater curvature of the gastric body.      Two localized 3 mm erosions with no stigmata of recent bleeding were       found on the lesser curvature of the gastric body.      Scattered moderate inflammation characterized by congestion (edema) and       erythema was found in the gastric antrum. Biopsies were taken with a       cold forceps for histology.      The examined duodenum was normal. Impression:               - LA Grade A esophagitis with no bleeding.                           - A large amount of food (residue) in the stomach.                           - Erosive gastropathy with no stigmata of recent                            bleeding.                           - Gastritis. Biopsied.                           - Normal examined duodenum. Moderate Sedation:      See the other procedure note for documentation of moderate sedation with       intraservice time. Recommendation:           - Return patient to hospital ward for ongoing care.                           - Clear liquid diet.                           - Continue present medications.                           - Discontinue octreotide, no sign of esophageal  varices.                           - Check hemoglobin daily.                           - Perform an upper GI endoscopy tomorrow.                           - Provide Reglan 10 mg IV once this evening to                            induce gastric motility. Procedure Code(s):        --- Professional ---                           [id number], Esophagogastroduodenoscopy, flexible,                            transoral; with biopsy, single or multiple Diagnosis Code(s):        --- Professional ---                           K20.90, Esophagitis,  unspecified without bleeding                           K31.89, Other diseases of stomach and duodenum                           K29.70, Gastritis, unspecified, without bleeding                           K92.0, Hematemesis CPT copyright 2022 [employer]. All rights reserved. The codes documented in this report are preliminary and upon coder review may  be revised to meet current compliance requirements. Dr Liliane Shi, DO Liliane Shi DO, DO 12/22/2022 2:40:19 PM Number of Addenda: 0

## 2022-12-22 NOTE — ED Notes (Signed)
Holding LR w/Potassium Chl for Octreotide to complete

## 2022-12-22 NOTE — Progress Notes (Signed)
PROGRESS NOTE    Ronnie Burton  ZDG:[id number] DOB: 01/20/1961 DOA: 12/21/2022 PCP: Fleet Contras, MD   Brief Narrative:  This 62 years old male with PMH significant for hepatitis C, questionable cirrhosis, history of laparoscopic cholecystectomy 2 weeks ago presented in the ED with abrupt onset of vomiting and abdominal pain.  Patient stateS he was in usual state of health after cholecystectomy which has been managed with ibuprofen.  He started throwing up blood , reports has vomited 7 times, small amounts.  Vitals are stable, H&H stable.  Patient is admitted for further evaluation.  Assessment & Plan:   Principal Problem:   Upper GI bleeding Active Problems:   Hepatitis C   Cigarette smoker   Alcohol abuse   GI bleeding  Upper GI bleeding: Patient presented with throwing up blood. Patient is s/p laparoscopic cholecystectomy 2 weeks ago,  has been taking ibuprofen as a pain control. There is no further episodes of hematemesis in the ED. Hemodynamically stable.  H&H is stable.   Continue pantoprazole 40 mg IV every 12 hours. Continue IV fluid resuscitation.   Factors include smoking, alcohol use as well as ibuprofen use.   GI consulted.  Patient underwent EGD EGD showed  lots of retained food precluding ability to visualize entire stomach, saw possible small clean based ulcer in proximal stomach, some gastritis. No varices.  GI is planning to repeat EGD in the morning.  Alcohol abuse: Advised to quit given liver disease.   Cigarette smoker Routine replacement therapy, advised to quit smoking.   Hepatitis C Outpatient follow-up  DVT prophylaxis: SCDs Code Status: Full code Family Communication:No family at bed side Disposition Plan:     Status is: Inpatient Remains inpatient appropriate because: Admitted for upper GI bleed.  GI was consulted and scheduled to have EGD today.  Consultants:  Gastroenterology  Procedures:  Scheduled EGD  Antimicrobials:   Anti-infectives (From admission, onward)    None      Subjective: Patient was seen and examined at bedside.  Overnight events noted.   Patient underwent EGD found to have a lot of retained food products.  Unable to visualize.   GI planning to do EGD in the morning.  Objective: Vitals:   12/22/22 1100 12/22/22 1108 12/22/22 1311 12/22/22 1346  BP: (!) 154/89  134/67 (!) 143/69  Pulse: (!) 48  (!) 45 (!) 50  Resp: 14  18 11   Temp:  98.4 F (36.9 C) 98.8 F (37.1 C) (!) 97.3 F (36.3 C)  TempSrc:  Oral Oral Temporal  SpO2: 99%  99% 99%  Weight:   110.2 kg 110.2 kg  Height:   6\' 5"  (1.956 m) 6\' 5"  (1.956 m)    Intake/Output Summary (Last 24 hours) at 12/22/2022 1444 Last data filed at 12/22/2022 1430 Gross per 24 hour  Intake 550 ml  Output --  Net 550 ml   Filed Weights   12/21/22 1927 12/22/22 1311 12/22/22 1346  Weight: 106.6 kg 110.2 kg 110.2 kg    Examination:  General exam: Appears calm and comfortable, not in any acute distress. Respiratory system: Clear to auscultation. Respiratory effort normal.  RR 16 Cardiovascular system: S1 & S2 heard, RRR. No JVD, murmurs, rubs, gallops or clicks. No pedal edema. Gastrointestinal system: Abdomen is soft, non distended, mildly tender. Normal bowel sounds heard. Central nervous system: Alert and oriented. No focal neurological deficits. Extremities: Symmetric 5 x 5 power. Skin: No rashes, lesions or ulcers Psychiatry: Judgement and insight appear normal. Mood &  affect appropriate.     Data Reviewed: I have personally reviewed following labs and imaging studies  CBC: Recent Labs  Lab 12/21/22 1936 12/22/22 0556  WBC 9.7 9.0  HGB 13.9 14.1  HCT 41.1 41.0  MCV 93.2 93.6  PLT 151 143*   Basic Metabolic Panel: Recent Labs  Lab 12/21/22 1936 12/22/22 0556  NA 133* 136  K 3.4* 4.3  CL 104 106  CO2 18* 20*  GLUCOSE 89 164*  BUN 18 16  CREATININE 1.23 1.38*  CALCIUM 8.8* 9.0   GFR: Estimated Creatinine  Clearance: 76.5 mL/min (A) (by C-G formula based on SCr of 1.38 mg/dL (H)). Liver Function Tests: Recent Labs  Lab 12/21/22 1936  AST 47*  ALT 52*  ALKPHOS 64  BILITOT 0.8  PROT 7.9  ALBUMIN 3.8   Recent Labs  Lab 12/21/22 1936  LIPASE 42   No results for input(s): "AMMONIA" in the last 168 hours. Coagulation Profile: Recent Labs  Lab 12/21/22 2028  INR 1.0   Cardiac Enzymes: No results for input(s): "CKTOTAL", "CKMB", "CKMBINDEX", "TROPONINI" in the last 168 hours. BNP (last 3 results) No results for input(s): "PROBNP" in the last 8760 hours. HbA1C: No results for input(s): "HGBA1C" in the last 72 hours. CBG: No results for input(s): "GLUCAP" in the last 168 hours. Lipid Profile: No results for input(s): "CHOL", "HDL", "LDLCALC", "TRIG", "CHOLHDL", "LDLDIRECT" in the last 72 hours. Thyroid Function Tests: No results for input(s): "TSH", "T4TOTAL", "FREET4", "T3FREE", "THYROIDAB" in the last 72 hours. Anemia Panel: No results for input(s): "VITAMINB12", "FOLATE", "FERRITIN", "TIBC", "IRON", "RETICCTPCT" in the last 72 hours. Sepsis Labs: No results for input(s): "PROCALCITON", "LATICACIDVEN" in the last 168 hours.  No results found for this or any previous visit (from the past 240 hour(s)).   Radiology Studies: CT ABDOMEN PELVIS W CONTRAST  Result Date: 12/21/2022 CLINICAL DATA:  Postop abdominal pain following cholecystectomy 2 and half weeks ago. Diarrhea and vomiting with blood in vomit. EXAM: CT ABDOMEN AND PELVIS WITH CONTRAST TECHNIQUE: Multidetector CT imaging of the abdomen and pelvis was performed using the standard protocol following bolus administration of intravenous contrast. RADIATION DOSE REDUCTION: This exam was performed according to the departmental dose-optimization program which includes automated exposure control, adjustment of the mA and/or kV according to patient size and/or use of iterative reconstruction technique. CONTRAST:  OMNIPAQUE  IOHEXOL 300 MG/ML  SOLN COMPARISON:  09/17/2022. FINDINGS: Lower chest: No acute abnormality. Hepatobiliary: No focal liver abnormality is seen. Fatty infiltration of the liver is noted. Status post cholecystectomy with minimal residual fat stranding in the surgical bed. No abscess is seen. No biliary dilatation. Pancreas: Unremarkable. No pancreatic ductal dilatation or surrounding inflammatory changes. Spleen: Normal in size without focal abnormality. Adrenals/Urinary Tract: The adrenal glands are within normal limits. The kidneys enhance symmetrically. No renal or ureteral calculus or hydronephrosis. The bladder is unremarkable. Stomach/Bowel: Stomach is within normal limits. Appendix appears normal. No evidence of bowel wall thickening, distention, or inflammatory changes. No free air or pneumatosis. Vascular/Lymphatic: Aortic atherosclerosis. No enlarged abdominal or pelvic lymph nodes. Reproductive: Prostate is unremarkable. Other: No abdominopelvic ascites. Calcifications are noted in the mesentery in the left lower quadrant suggesting fat necrosis. A small fat containing umbilical hernia is present. Musculoskeletal: No acute osseous abnormality. IMPRESSION: 1. Status post cholecystectomy with minimal residual fat stranding in the gallbladder fossa. No abscess is seen. 2. Hepatic steatosis. Electronically Signed   By: Thornell Sartorius M.D.   On: 12/21/2022 21:00   DG  Chest Portable 1 View  Result Date: 12/21/2022 CLINICAL DATA:  Vomiting blood.  Recent cholecystectomy. EXAM: PORTABLE CHEST 1 VIEW COMPARISON:  01/08/2022 FINDINGS: The cardiomediastinal contours are normal. No evidence of pneumomediastinum. The lungs are clear. Pulmonary vasculature is normal. No consolidation, pleural effusion, or pneumothorax. No acute osseous abnormalities are seen. IMPRESSION: No acute chest findings. Electronically Signed   By: Narda Rutherford M.D.   On: 12/21/2022 20:33    Scheduled Meds:  [MAR Hold] pantoprazole  40  mg Intravenous Q12H   [MAR Hold] sodium chloride flush  3 mL Intravenous Q12H   Continuous Infusions:  sodium chloride     lactated ringers 1,000 mL with potassium chloride 20 mEq infusion 100 mL/hr at 12/22/22 0917   octreotide (SANDOSTATIN) 500 mcg in sodium chloride 0.9 % 250 mL (2 mcg/mL) infusion Stopped (12/22/22 0916)   pantoprazole 8 mg/hr (12/22/22 0748)     LOS: 1 day    Time spent: 50 MINS    Willeen Niece, MD [facility]   If 7PM-7AM, please contact night-coverage

## 2022-12-22 NOTE — ED Notes (Signed)
ED TO INPATIENT HANDOFF REPORT  ED Nurse Name and Phone #: Durene Cal RN   S Name/Age/Gender Ronnie Burton 62 y.o. male Room/Bed: WA02/WA02  Code Status   Code Status: Full Code  Home/SNF/Other Home Patient oriented to: self, place, time, and situation Is this baseline? Yes   Triage Complete: Triage complete  Chief Complaint GI bleeding [K92.2]  Triage Note Pt presents via EMS c/o upper abd pain starting today. Reports had cholecystectomy x2.5 weeks ago. Reports has had diarrhea since having cholecystectomy. Reports vomiting x8 with blood in vomit. Reports large amount of blood in vomit. A&O x4.    Allergies No Known Allergies  Level of Care/Admitting Diagnosis ED Disposition     ED Disposition  Admit   Condition  --   Comment  Hospital Area: [facility] [[id number]]  Level of Care: Progressive [102]  Admit to Progressive based on following criteria: GI, ENDOCRINE disease patients with GI bleeding, acute liver failure or pancreatitis, stable with diabetic ketoacidosis or thyrotoxicosis (hypothyroid) state.  May admit patient to [facility] or [facility] if equivalent level of care is available:: No  Covid Evaluation: Asymptomatic - no recent exposure (last 10 days) testing not required  Diagnosis: GI bleeding [[id number]]  Admitting Physician: Nolberto Hanlon [[id number]]  Attending Physician: Nolberto Hanlon [[id number]]  Certification:: I certify this patient will need inpatient services for at least 2 midnights  Expected Medical Readiness: 12/23/2022          B Medical/Surgery History Past Medical History:  Diagnosis Date   Bipolar 1 disorder (HCC)    Headache    deneis   Heart murmur    Hepatitis    hep C, treated   Hypertension    Myocardial infarction (HCC)    mild when 30   Schizophrenia (HCC)    Past Surgical History:  Procedure Laterality Date   CHOLECYSTECTOMY N/A 12/05/2022   Procedure: LAPAROSCOPIC CHOLECYSTECTOMY;  Surgeon: Quentin Dennie Vecchio, MD;  Location: [facility] ORS;  Service: General;  Laterality: N/A;   COLONOSCOPY       A IV Location/Drains/Wounds Patient Lines/Drains/Airways Status     Active Line/Drains/Airways     Name Placement date Placement time Site Days   Peripheral IV 12/21/22 20 G Distal;Left;Posterior Forearm 12/21/22  1924  Forearm  1   Incision - 4 Ports Abdomen 1: Right 2: Mid 3: Umbilicus 4: Upper 12/05/22  0900  -- 17   Wound / Incision (Open or Dehisced) 02/23/15 Other (Comment) Arm Left 02/23/15  1315  Arm  2859            Intake/Output Last 24 hours No intake or output data in the 24 hours ending 12/22/22 0645  Labs/Imaging Results for orders placed or performed during the hospital encounter of 12/21/22 (from the past 48 hour(s))  Urinalysis, Routine w reflex microscopic -Urine, Clean Catch     Status: Abnormal   Collection Time: 12/21/22  7:33 PM  Result Value Ref Range   Color, Urine STRAW (A) YELLOW   APPearance CLEAR CLEAR   Specific Gravity, Urine 1.002 (L) 1.005 - 1.030   pH 6.0 5.0 - 8.0   Glucose, UA NEGATIVE NEGATIVE mg/dL   Hgb urine dipstick NEGATIVE NEGATIVE   Bilirubin Urine NEGATIVE NEGATIVE   Ketones, ur NEGATIVE NEGATIVE mg/dL   Protein, ur NEGATIVE NEGATIVE mg/dL   Nitrite NEGATIVE NEGATIVE   Leukocytes,Ua NEGATIVE NEGATIVE    Comment: Performed at [facility], [address] [place], [place] [address]  Lipase, blood     Status: None   Collection Time: 12/21/22  7:36 PM  Result Value Ref Range   Lipase 42 11 - 51 U/L    Comment: Performed at [facility], [address] [place], [place] [address]  Comprehensive metabolic panel     Status: Abnormal   Collection Time: 12/21/22  7:36 PM  Result Value Ref Range   Sodium 133 (L) 135 - 145 mmol/L   Potassium 3.4 (L) 3.5 - 5.1 mmol/L   Chloride 104 98 - 111 mmol/L   CO2 18 (L) 22 - 32 mmol/L   Glucose, Bld 89 70 - 99 mg/dL    Comment: Glucose reference range applies only to  samples taken after fasting for at least 8 hours.   BUN 18 8 - 23 mg/dL   Creatinine, Ser 6.57 0.61 - 1.24 mg/dL   Calcium 8.8 (L) 8.9 - 10.3 mg/dL   Total Protein 7.9 6.5 - 8.1 g/dL   Albumin 3.8 3.5 - 5.0 g/dL   AST 47 (H) 15 - 41 U/L   ALT 52 (H) 0 - 44 U/L   Alkaline Phosphatase 64 38 - 126 U/L   Total Bilirubin 0.8 0.3 - 1.2 mg/dL   GFR, Estimated >84 >69 mL/min    Comment: (NOTE) Calculated using the CKD-EPI Creatinine Equation (2021)    Anion gap 11 5 - 15    Comment: Performed at [facility], [address], [place], [place] [address]  CBC     Status: None   Collection Time: 12/21/22  7:36 PM  Result Value Ref Range   WBC 9.7 4.0 - 10.5 K/uL   RBC 4.41 4.22 - 5.81 MIL/uL   Hemoglobin 13.9 13.0 - 17.0 g/dL   HCT 84.1 32.4 - 40.1 %   MCV 93.2 80.0 - 100.0 fL   MCH 31.5 26.0 - 34.0 pg   MCHC 33.8 30.0 - 36.0 g/dL   RDW 02.7 25.3 - 66.4 %   Platelets 151 150 - 400 K/uL   nRBC 0.0 0.0 - 0.2 %    Comment: Performed at [facility], [address] [place], [place] [address]  Ethanol     Status: Abnormal   Collection Time: 12/21/22  7:36 PM  Result Value Ref Range   Alcohol, Ethyl (B) 118 (H) <10 mg/dL    Comment: (NOTE) Lowest detectable limit for serum alcohol is 10 mg/dL.  For medical purposes only. Performed at [facility], [address] [place], [place] [address]   POC occult blood, ED     Status: None   Collection Time: 12/21/22  8:04 PM  Result Value Ref Range   Fecal Occult Bld NEGATIVE NEGATIVE  Protime-INR     Status: None   Collection Time: 12/21/22  8:28 PM  Result Value Ref Range   Prothrombin Time 13.7 11.4 - 15.2 seconds   INR 1.0 0.8 - 1.2    Comment: (NOTE) INR goal varies based on device and disease states. Performed at [facility], [address] [place], [place] [address]   Type and screen [facility]     Status: None   Collection Time:  12/21/22  8:31 PM  Result Value Ref Range   ABO/RH(D) O POS    Antibody Screen NEG    Sample Expiration      12/24/2022,2359 Performed at [facility], [address], [place], [place] [address]   ABO/Rh  Status: None   Collection Time: 12/21/22  8:52 PM  Result Value Ref Range   ABO/RH(D)      O POS Performed at [facility], [address] [place], [place] [address]    CT ABDOMEN PELVIS W CONTRAST  Result Date: 12/21/2022 CLINICAL DATA:  Postop abdominal pain following cholecystectomy 2 and half weeks ago. Diarrhea and vomiting with blood in vomit. EXAM: CT ABDOMEN AND PELVIS WITH CONTRAST TECHNIQUE: Multidetector CT imaging of the abdomen and pelvis was performed using the standard protocol following bolus administration of intravenous contrast. RADIATION DOSE REDUCTION: This exam was performed according to the departmental dose-optimization program which includes automated exposure control, adjustment of the mA and/or kV according to patient size and/or use of iterative reconstruction technique. CONTRAST:  OMNIPAQUE IOHEXOL 300 MG/ML  SOLN COMPARISON:  09/17/2022. FINDINGS: Lower chest: No acute abnormality. Hepatobiliary: No focal liver abnormality is seen. Fatty infiltration of the liver is noted. Status post cholecystectomy with minimal residual fat stranding in the surgical bed. No abscess is seen. No biliary dilatation. Pancreas: Unremarkable. No pancreatic ductal dilatation or surrounding inflammatory changes. Spleen: Normal in size without focal abnormality. Adrenals/Urinary Tract: The adrenal glands are within normal limits. The kidneys enhance symmetrically. No renal or ureteral calculus or hydronephrosis. The bladder is unremarkable. Stomach/Bowel: Stomach is within normal limits. Appendix appears normal. No evidence of bowel wall thickening, distention, or inflammatory changes. No free air or pneumatosis. Vascular/Lymphatic: Aortic  atherosclerosis. No enlarged abdominal or pelvic lymph nodes. Reproductive: Prostate is unremarkable. Other: No abdominopelvic ascites. Calcifications are noted in the mesentery in the left lower quadrant suggesting fat necrosis. A small fat containing umbilical hernia is present. Musculoskeletal: No acute osseous abnormality. IMPRESSION: 1. Status post cholecystectomy with minimal residual fat stranding in the gallbladder fossa. No abscess is seen. 2. Hepatic steatosis. Electronically Signed   By: Thornell Sartorius M.D.   On: 12/21/2022 21:00   DG Chest Portable 1 View  Result Date: 12/21/2022 CLINICAL DATA:  Vomiting blood.  Recent cholecystectomy. EXAM: PORTABLE CHEST 1 VIEW COMPARISON:  01/08/2022 FINDINGS: The cardiomediastinal contours are normal. No evidence of pneumomediastinum. The lungs are clear. Pulmonary vasculature is normal. No consolidation, pleural effusion, or pneumothorax. No acute osseous abnormalities are seen. IMPRESSION: No acute chest findings. Electronically Signed   By: Narda Rutherford M.D.   On: 12/21/2022 20:33    Pending Labs Unresulted Labs (From admission, onward)     Start     Ordered   12/22/22 0500  CBC  Tomorrow morning,   R        12/21/22 2307   12/22/22 0500  Basic metabolic panel  Tomorrow morning,   R        12/21/22 2312   12/21/22 2312  APTT  Add-on,   AD        12/21/22 2312   12/21/22 2311  HIV Antibody (routine testing w rflx)  (HIV Antibody (Routine testing w reflex) panel)  Once,   R        12/21/22 2312            Vitals/Pain Today's Vitals   12/22/22 0215 12/22/22 0300 12/22/22 0500 12/22/22 0629  BP: 139/82 (!) 142/76 (!) 129/59   Pulse: 65 (!) 58 (!) 58   Resp: (!) 21 19 17    Temp:    98.2 F (36.8 C)  TempSrc:    Oral  SpO2: 98% 95% 96%   Weight:      Height:  PainSc:        Isolation Precautions No active isolations  Medications Medications  pantoprozole (PROTONIX) 80 mg /NS 100 mL infusion (8 mg/hr Intravenous New  Bag/Given 12/21/22 2307)  pantoprazole (PROTONIX) injection 40 mg (has no administration in time range)  octreotide (SANDOSTATIN) 2 mcg/mL load via infusion 50 mcg (50 mcg Intravenous Bolus from Bag 12/21/22 2306)    And  octreotide (SANDOSTATIN) 500 mcg in sodium chloride 0.9 % 250 mL (2 mcg/mL) infusion (50 mcg/hr Intravenous Restarted 12/22/22 0555)  nicotine polacrilex (NICORETTE) gum 2 mg (has no administration in time range)  methocarbamol (ROBAXIN) tablet 750 mg (has no administration in time range)  acetaminophen (TYLENOL) tablet 650 mg (has no administration in time range)    Or  acetaminophen (TYLENOL) suppository 650 mg (has no administration in time range)  polyethylene glycol (MIRALAX / GLYCOLAX) packet 17 g (has no administration in time range)  sodium chloride flush (NS) 0.9 % injection 3 mL (has no administration in time range)  lactated ringers 1,000 mL with potassium chloride 20 mEq infusion (has no administration in time range)  sodium chloride 0.9 % bolus 1,000 mL (0 mLs Intravenous Stopped 12/21/22 2221)  ondansetron (ZOFRAN) injection 4 mg (4 mg Intravenous Given 12/21/22 2026)  pantoprazole (PROTONIX) 80 mg /NS 100 mL IVPB (0 mg Intravenous Stopped 12/21/22 2221)  iohexol (OMNIPAQUE) 300 MG/ML solution 100 mL (100 mLs Intravenous Contrast Given 12/21/22 2032)  potassium chloride 10 mEq in 100 mL IVPB (0 mEq Intravenous Stopped 12/22/22 0431)    Mobility walks     Focused Assessments GI    R Recommendations: See Admitting Provider Note  Report given to:   Additional Notes: n/a

## 2022-12-22 NOTE — Anesthesia Preprocedure Evaluation (Addendum)
Anesthesia Evaluation  Patient identified by MRN, date of birth, ID band Patient awake    Reviewed: Allergy & Precautions, NPO status , Patient's Chart, lab work & pertinent test results  History of Anesthesia Complications Negative for: history of anesthetic complications  Airway Mallampati: I  TM Distance: >3 FB Neck ROM: Full    Dental  (+) Edentulous Upper, Edentulous Lower   Pulmonary Current Smoker and Patient abstained from smoking.   Pulmonary exam normal        Cardiovascular hypertension, + Past MI  Normal cardiovascular exam     Neuro/Psych  Headaches PSYCHIATRIC DISORDERS   Bipolar Disorder    Schizoaffective d/o    GI/Hepatic ,,,(+)     substance abuse  alcohol use and IV drug use, Hepatitis -, C Hematemesis yesterday. Denies nausea today     Endo/Other  negative endocrine ROS    Renal/GU Renal InsufficiencyRenal disease     Musculoskeletal negative musculoskeletal ROS (+)    Abdominal   Peds  Hematology  Plt 143k    Anesthesia Other Findings   Reproductive/Obstetrics                             Anesthesia Physical Anesthesia Plan  ASA: 3  Anesthesia Plan: MAC   Post-op Pain Management: Minimal or no pain anticipated   Induction:   PONV Risk Score and Plan: 0 and Treatment may vary due to age or medical condition and Propofol infusion  Airway Management Planned: Nasal Cannula and Natural Airway  Additional Equipment: None  Intra-op Plan:   Post-operative Plan:   Informed Consent: I have reviewed the patients History and Physical, chart, labs and discussed the procedure including the risks, benefits and alternatives for the proposed anesthesia with the patient or authorized representative who has indicated his/her understanding and acceptance.       Plan Discussed with: CRNA and Anesthesiologist  Anesthesia Plan Comments:        Anesthesia  Quick Evaluation

## 2022-12-22 NOTE — H&P (View-Only) (Signed)
[facility] Gastroenterology Consult  Referring Provider: No ref. provider found Primary Care Physician:  Fleet Contras, MD Primary Gastroenterologist: [facility] - follows for chronic liver disease and was last seen in office on 12/17/22  Reason for Consultation: Hematemesis  SUBJECTIVE:   HPI: Ronnie Burton is a 62 y.o. male with past medical history significant for advanced liver fibrosis with history of hepatitis C virus status post Epclusa treatment with sustained virologic response, current EtOH use (last EtOH was 12/21/22, 3 beers). He underwent laparoscopic cholecystectomy on 12/05/22. Medical history also significant for hypertension and bipolar disorder.   He noted that he began to have never before hematemesis yesterday. He had 8 episodes of hematemesis at home, first episode began with blood. He described it as streaks of blood in emesis. No chest pain or shortness of breath. He has been having abdominal discomfort he relates to post-operative changes. He has been experiencing nausea also. He has been using NSAIDs every 6 hours for pain relief post-operatively. He denied prior EGD. He has had no further episodes of hematemesis since presentation to hospital. He is on IV PPI and octreotide.   Last colonoscopy completed 02/08/22 (Dr. Marca Ancona) for first ever colorectal cancer screening with findings of small sigmoid hyperplastic polyps status post cold forceps polypectomy, transverse colon polyp x 3 status post cold forceps polypectomy (pathology showed tubular adenoma x 2), non-bleeding internal hemorrhoids.   Past Medical History:  Diagnosis Date   Bipolar 1 disorder (HCC)    Headache    deneis   Heart murmur    Hepatitis    hep C, treated   Hypertension    Myocardial infarction (HCC)    mild when 30   Schizophrenia (HCC)    Past Surgical History:  Procedure Laterality Date   CHOLECYSTECTOMY N/A 12/05/2022   Procedure: LAPAROSCOPIC CHOLECYSTECTOMY;  Surgeon: Quentin Ore,  MD;  Location: [facility];  Service: General;  Laterality: N/A;   COLONOSCOPY     Prior to Admission medications   Medication Sig Start Date End Date Taking? Authorizing Provider  albuterol (VENTOLIN HFA) 108 (90 Base) MCG/ACT inhaler Inhale 1-2 puffs into the lungs every 6 (six) hours as needed for wheezing or shortness of breath. 01/08/22  Yes Raspet, Erin K, PA-C  fluticasone (FLONASE) 50 MCG/ACT nasal spray Place 1 spray into both nostrils daily as needed for allergies or rhinitis.   Yes [provider]  ibuprofen (ADVIL,MOTRIN) 600 MG tablet Take 1 tablet (600 mg total) by mouth every 6 (six) hours as needed. 08/02/16  Yes Fayrene Helper, PA-C  methocarbamol (ROBAXIN-750) 750 MG tablet Take 1 tablet (750 mg total) by mouth every 6 (six) hours as needed for muscle spasms. 12/05/22  Yes Stechschulte, Hyman Hopes, MD  oxyCODONE-acetaminophen (PERCOCET) 5-325 MG tablet Take 1 tablet by mouth every 4 (four) hours as needed for severe pain. 12/05/22 12/05/23 Yes Stechschulte, Hyman Hopes, MD  Vitamin D, Ergocalciferol, (DRISDOL) 1.25 MG (50000 UNIT) CAPS capsule Take 50,000 Units by mouth once a week. 10/24/22  Yes [provider]  amoxicillin-clavulanate (AUGMENTIN) 875-125 MG tablet Take 1 tablet by mouth every 12 (twelve) hours. Patient not taking: Reported on 11/20/2022 01/08/22   Raspet, Noberto Retort, PA-C   Current Facility-Administered Medications  Medication Dose Route Frequency Provider Last Rate Last Admin   acetaminophen (TYLENOL) tablet 650 mg  650 mg Oral Q6H PRN Nolberto Hanlon, MD       Or   acetaminophen (TYLENOL) suppository 650 mg  650 mg Rectal Q6H PRN Maryjean Ka,  Charmayne Sheer, MD       lactated ringers 1,000 mL with potassium chloride 20 mEq infusion   Intravenous Continuous Willeen Niece, MD 100 mL/hr at 12/22/22 0917 New Bag at 12/22/22 0917   methocarbamol (ROBAXIN) tablet 750 mg  750 mg Oral Q6H PRN Nolberto Hanlon, MD       nicotine polacrilex (NICORETTE) gum 2 mg  2 mg Oral PRN Nolberto Hanlon, MD        octreotide (SANDOSTATIN) 500 mcg in sodium chloride 0.9 % 250 mL (2 mcg/mL) infusion  50 mcg/hr Intravenous Continuous Nolberto Hanlon, MD   Stopped at 12/22/22 0916   [START ON 12/25/2022] pantoprazole (PROTONIX) injection 40 mg  40 mg Intravenous Q12H Nolberto Hanlon, MD       pantoprozole (PROTONIX) 80 mg /NS 100 mL infusion  8 mg/hr Intravenous Continuous Nolberto Hanlon, MD 10 mL/hr at 12/22/22 0748 8 mg/hr at 12/22/22 0748   polyethylene glycol (MIRALAX / GLYCOLAX) packet 17 g  17 g Oral Daily PRN Nolberto Hanlon, MD       sodium chloride flush (NS) 0.9 % injection 3 mL  3 mL Intravenous Conchita Paris, MD   3 mL at 12/22/22 6213   Current Outpatient Medications  Medication Sig Dispense Refill   albuterol (VENTOLIN HFA) 108 (90 Base) MCG/ACT inhaler Inhale 1-2 puffs into the lungs every 6 (six) hours as needed for wheezing or shortness of breath. 18 g 0   fluticasone (FLONASE) 50 MCG/ACT nasal spray Place 1 spray into both nostrils daily as needed for allergies or rhinitis.     ibuprofen (ADVIL,MOTRIN) 600 MG tablet Take 1 tablet (600 mg total) by mouth every 6 (six) hours as needed. 30 tablet 0   methocarbamol (ROBAXIN-750) 750 MG tablet Take 1 tablet (750 mg total) by mouth every 6 (six) hours as needed for muscle spasms. 30 tablet 0   oxyCODONE-acetaminophen (PERCOCET) 5-325 MG tablet Take 1 tablet by mouth every 4 (four) hours as needed for severe pain. 10 tablet 0   Vitamin D, Ergocalciferol, (DRISDOL) 1.25 MG (50000 UNIT) CAPS capsule Take 50,000 Units by mouth once a week.     amoxicillin-clavulanate (AUGMENTIN) 875-125 MG tablet Take 1 tablet by mouth every 12 (twelve) hours. (Patient not taking: Reported on 11/20/2022) 14 tablet 0   Allergies as of 12/21/2022   (No Known Allergies)   Family History  Problem Relation Age of Onset   Alcoholism Father    Social History   Socioeconomic History   Marital status: Married    Spouse name: Not on file   Number of children: Not on file   Years of  education: Not on file   Highest education level: Not on file  Occupational History   Not on file  Tobacco Use   Smoking status: Every Day    Current packs/day: 0.25    Average packs/day: 0.3 packs/day for 20.0 years (5.0 ttl pk-yrs)    Types: Cigarettes   Smokeless tobacco: Never  Vaping Use   Vaping status: Never Used  Substance and Sexual Activity   Alcohol use: Yes    Alcohol/week: 8.0 standard drinks of alcohol    Types: 8 Cans of beer per week   Drug use: No   Sexual activity: Yes  Other Topics Concern   Not on file  Social History Narrative   Not on file   Social Determinants of Health   Financial Resource Strain: High Risk (12/08/2021)   [facility], [facility]   Overall  Financial Resource Strain (CARDIA)    Difficulty of Paying Living Expenses: Hard  Food Insecurity: Food Insecurity Present (12/08/2021)   [facility]   Hunger Vital Sign    Worried About Running Out of Food in the Last Year: Often true    Ran Out of Food in the Last Year: Often true  Transportation Needs: No Transportation Needs (12/08/2021)   [facility]   PRAPARE - Transportation    Lack of Transportation (Medical): No    Lack of Transportation (Non-Medical): No  Physical Activity: Not on file  Stress: Not on file  Social Connections: Unknown (10/08/2022)   Received from [facility]   Social Network    Social Network: Not on file  Intimate Partner Violence: Unknown (10/08/2022)   Received from [facility]   HITS    Physically Hurt: Not on file    Insult or Talk Down To: Not on file    Threaten Physical Harm: Not on file    Scream or Curse: Not on file   Review of Systems:  Review of Systems  Respiratory:  Negative for shortness of breath.   Cardiovascular:  Negative for chest pain.  Gastrointestinal:  Positive for abdominal pain, nausea and vomiting.       Hematemesis.    OBJECTIVE:   Temp:  [98.2 F  (36.8 C)-98.4 F (36.9 C)] 98.2 F (36.8 C) (08/31 0629) Pulse Rate:  [52-66] 52 (08/31 0800) Resp:  [16-21] 17 (08/31 0800) BP: (129-143)/(59-82) 134/69 (08/31 0800) SpO2:  [94 %-100 %] 94 % (08/31 0800) Weight:  [106.6 kg] 106.6 kg (08/30 1927)   Physical Exam Constitutional:      General: He is not in acute distress.    Appearance: He is not ill-appearing, toxic-appearing or diaphoretic.  Cardiovascular:     Rate and Rhythm: Normal rate and regular rhythm.  Pulmonary:     Effort: No respiratory distress.     Breath sounds: Normal breath sounds.  Abdominal:     General: Bowel sounds are normal. There is no distension.     Palpations: Abdomen is soft.     Tenderness: There is abdominal tenderness (diffuse, epigastric). There is no guarding.  Musculoskeletal:     Right lower leg: No edema.     Left lower leg: No edema.  Skin:    General: Skin is warm and dry.     Comments: Incisions on anterior abdomen appear well healing.  Neurological:     Mental Status: He is alert.     Labs: Recent Labs    12/21/22 1936 12/22/22 0556  WBC 9.7 9.0  HGB 13.9 14.1  HCT 41.1 41.0  PLT 151 143*   BMET Recent Labs    12/21/22 1936 12/22/22 0556  NA 133* 136  K 3.4* 4.3  CL 104 106  CO2 18* 20*  GLUCOSE 89 164*  BUN 18 16  CREATININE 1.23 1.38*  CALCIUM 8.8* 9.0   LFT Recent Labs    12/21/22 1936  PROT 7.9  ALBUMIN 3.8  AST 47*  ALT 52*  ALKPHOS 64  BILITOT 0.8   PT/INR Recent Labs    12/21/22 2028  LABPROT 13.7  INR 1.0    Diagnostic imaging: CT ABDOMEN PELVIS W CONTRAST  Result Date: 12/21/2022 CLINICAL DATA:  Postop abdominal pain following cholecystectomy 2 and half weeks ago. Diarrhea and vomiting with blood in vomit. EXAM: CT ABDOMEN AND PELVIS WITH CONTRAST TECHNIQUE: Multidetector CT imaging of the  abdomen and pelvis was performed using the standard protocol following bolus administration of intravenous contrast. RADIATION DOSE REDUCTION: This exam  was performed according to the departmental dose-optimization program which includes automated exposure control, adjustment of the mA and/or kV according to patient size and/or use of iterative reconstruction technique. CONTRAST:  OMNIPAQUE IOHEXOL 300 MG/ML  SOLN COMPARISON:  09/17/2022. FINDINGS: Lower chest: No acute abnormality. Hepatobiliary: No focal liver abnormality is seen. Fatty infiltration of the liver is noted. Status post cholecystectomy with minimal residual fat stranding in the surgical bed. No abscess is seen. No biliary dilatation. Pancreas: Unremarkable. No pancreatic ductal dilatation or surrounding inflammatory changes. Spleen: Normal in size without focal abnormality. Adrenals/Urinary Tract: The adrenal glands are within normal limits. The kidneys enhance symmetrically. No renal or ureteral calculus or hydronephrosis. The bladder is unremarkable. Stomach/Bowel: Stomach is within normal limits. Appendix appears normal. No evidence of bowel wall thickening, distention, or inflammatory changes. No free air or pneumatosis. Vascular/Lymphatic: Aortic atherosclerosis. No enlarged abdominal or pelvic lymph nodes. Reproductive: Prostate is unremarkable. Other: No abdominopelvic ascites. Calcifications are noted in the mesentery in the left lower quadrant suggesting fat necrosis. A small fat containing umbilical hernia is present. Musculoskeletal: No acute osseous abnormality. IMPRESSION: 1. Status post cholecystectomy with minimal residual fat stranding in the gallbladder fossa. No abscess is seen. 2. Hepatic steatosis. Electronically Signed   By: Thornell Sartorius M.D.   On: 12/21/2022 21:00   DG Chest Portable 1 View  Result Date: 12/21/2022 CLINICAL DATA:  Vomiting blood.  Recent cholecystectomy. EXAM: PORTABLE CHEST 1 VIEW COMPARISON:  01/08/2022 FINDINGS: The cardiomediastinal contours are normal. No evidence of pneumomediastinum. The lungs are clear. Pulmonary vasculature is normal. No  consolidation, pleural effusion, or pneumothorax. No acute osseous abnormalities are seen. IMPRESSION: No acute chest findings. Electronically Signed   By: Narda Rutherford M.D.   On: 12/21/2022 20:33    IMPRESSION: Hematemesis, stable  -NSAID use since lap chole 12/05/22 Advanced liver fibrosis  -History HCV s/p Epclusa with SVR  -Current EtOH use   -No prior EGD Status post laparoscopic cholecystectomy 12/05/22 Hypertension  PLAN: -Recommend EGD to further evaluate hematemesis, discussed procedure in detail with patient including benefits, alternatives and risks of bleeding/infection/perforation/anesthesia, he verbalized understanding and elected to proceed -Undetermined endoscopy availability today, maintain NPO for now -Continue IV PPI and octreotide for now -Trend H/H, transfuse for Hgb < 7  -Avoid all NSAIDs moving forward  -Alcohol cessation as recommended by hepatologist Deboraha Sprang GI will follow and update on procedural timing as appropriate   LOS: 1 day   Liliane Shi, [facility] Gastroenterology

## 2022-12-22 NOTE — Consult Note (Signed)
[facility] Consult  Referring Provider: No ref. provider found Primary Care Physician:  Fleet Contras, MD Primary Gastroenterologist: [facility] - follows for chronic liver disease and was last seen in office on 12/17/22  Reason for Consultation: Hematemesis  SUBJECTIVE:   HPI: Ronnie Burton is a 62 y.o. male with past medical history significant for advanced liver fibrosis with history of hepatitis C virus status post Epclusa treatment with sustained virologic response, current EtOH use (last EtOH was 12/21/22, 3 beers). He underwent laparoscopic cholecystectomy on 12/05/22. Medical history also significant for hypertension and bipolar disorder.   He noted that he began to have never before hematemesis yesterday. He had 8 episodes of hematemesis at home, first episode began with blood. He described it as streaks of blood in emesis. No chest pain or shortness of breath. He has been having abdominal discomfort he relates to post-operative changes. He has been experiencing nausea also. He has been using NSAIDs every 6 hours for pain relief post-operatively. He denied prior EGD. He has had no further episodes of hematemesis since presentation to hospital. He is on IV PPI and octreotide.   Last colonoscopy completed 02/08/22 (Dr. Marca Ancona) for first ever colorectal cancer screening with findings of small sigmoid hyperplastic polyps status post cold forceps polypectomy, transverse colon polyp x 3 status post cold forceps polypectomy (pathology showed tubular adenoma x 2), non-bleeding internal hemorrhoids.   Past Medical History:  Diagnosis Date   Bipolar 1 disorder (HCC)    Headache    deneis   Heart murmur    Hepatitis    hep C, treated   Hypertension    Myocardial infarction (HCC)    mild when 30   Schizophrenia (HCC)    Past Surgical History:  Procedure Laterality Date   CHOLECYSTECTOMY N/A 12/05/2022   Procedure: LAPAROSCOPIC CHOLECYSTECTOMY;  Surgeon: Quentin Ore,  MD;  Location: [facility];  Service: General;  Laterality: N/A;   COLONOSCOPY     Prior to Admission medications   Medication Sig Start Date End Date Taking? Authorizing Provider  albuterol (VENTOLIN HFA) 108 (90 Base) MCG/ACT inhaler Inhale 1-2 puffs into the lungs every 6 (six) hours as needed for wheezing or shortness of breath. 01/08/22  Yes Raspet, Erin K, PA-C  fluticasone (FLONASE) 50 MCG/ACT nasal spray Place 1 spray into both nostrils daily as needed for allergies or rhinitis.   Yes [provider]  ibuprofen (ADVIL,MOTRIN) 600 MG tablet Take 1 tablet (600 mg total) by mouth every 6 (six) hours as needed. 08/02/16  Yes Fayrene Helper, PA-C  methocarbamol (ROBAXIN-750) 750 MG tablet Take 1 tablet (750 mg total) by mouth every 6 (six) hours as needed for muscle spasms. 12/05/22  Yes Stechschulte, Hyman Hopes, MD  oxyCODONE-acetaminophen (PERCOCET) 5-325 MG tablet Take 1 tablet by mouth every 4 (four) hours as needed for severe pain. 12/05/22 12/05/23 Yes Stechschulte, Hyman Hopes, MD  Vitamin D, Ergocalciferol, (DRISDOL) 1.25 MG (50000 UNIT) CAPS capsule Take 50,000 Units by mouth once a week. 10/24/22  Yes [provider]  amoxicillin-clavulanate (AUGMENTIN) 875-125 MG tablet Take 1 tablet by mouth every 12 (twelve) hours. Patient not taking: Reported on 11/20/2022 01/08/22   Raspet, Noberto Retort, PA-C   Current Facility-Administered Medications  Medication Dose Route Frequency Provider Last Rate Last Admin   acetaminophen (TYLENOL) tablet 650 mg  650 mg Oral Q6H PRN Nolberto Hanlon, MD       Or   acetaminophen (TYLENOL) suppository 650 mg  650 mg Rectal Q6H PRN Maryjean Ka,  Charmayne Sheer, MD       lactated ringers 1,000 mL with potassium chloride 20 mEq infusion   Intravenous Continuous Willeen Niece, MD 100 mL/hr at 12/22/22 0917 New Bag at 12/22/22 0917   methocarbamol (ROBAXIN) tablet 750 mg  750 mg Oral Q6H PRN Nolberto Hanlon, MD       nicotine polacrilex (NICORETTE) gum 2 mg  2 mg Oral PRN Nolberto Hanlon, MD        octreotide (SANDOSTATIN) 500 mcg in sodium chloride 0.9 % 250 mL (2 mcg/mL) infusion  50 mcg/hr Intravenous Continuous Nolberto Hanlon, MD   Stopped at 12/22/22 0916   [START ON 12/25/2022] pantoprazole (PROTONIX) injection 40 mg  40 mg Intravenous Q12H Nolberto Hanlon, MD       pantoprozole (PROTONIX) 80 mg /NS 100 mL infusion  8 mg/hr Intravenous Continuous Nolberto Hanlon, MD 10 mL/hr at 12/22/22 0748 8 mg/hr at 12/22/22 0748   polyethylene glycol (MIRALAX / GLYCOLAX) packet 17 g  17 g Oral Daily PRN Nolberto Hanlon, MD       sodium chloride flush (NS) 0.9 % injection 3 mL  3 mL Intravenous Conchita Paris, MD   3 mL at 12/22/22 6213   Current Outpatient Medications  Medication Sig Dispense Refill   albuterol (VENTOLIN HFA) 108 (90 Base) MCG/ACT inhaler Inhale 1-2 puffs into the lungs every 6 (six) hours as needed for wheezing or shortness of breath. 18 g 0   fluticasone (FLONASE) 50 MCG/ACT nasal spray Place 1 spray into both nostrils daily as needed for allergies or rhinitis.     ibuprofen (ADVIL,MOTRIN) 600 MG tablet Take 1 tablet (600 mg total) by mouth every 6 (six) hours as needed. 30 tablet 0   methocarbamol (ROBAXIN-750) 750 MG tablet Take 1 tablet (750 mg total) by mouth every 6 (six) hours as needed for muscle spasms. 30 tablet 0   oxyCODONE-acetaminophen (PERCOCET) 5-325 MG tablet Take 1 tablet by mouth every 4 (four) hours as needed for severe pain. 10 tablet 0   Vitamin D, Ergocalciferol, (DRISDOL) 1.25 MG (50000 UNIT) CAPS capsule Take 50,000 Units by mouth once a week.     amoxicillin-clavulanate (AUGMENTIN) 875-125 MG tablet Take 1 tablet by mouth every 12 (twelve) hours. (Patient not taking: Reported on 11/20/2022) 14 tablet 0   Allergies as of 12/21/2022   (No Known Allergies)   Family History  Problem Relation Age of Onset   Alcoholism Father    Social History   Socioeconomic History   Marital status: Married    Spouse name: Not on file   Number of children: Not on file   Years of  education: Not on file   Highest education level: Not on file  Occupational History   Not on file  Tobacco Use   Smoking status: Every Day    Current packs/day: 0.25    Average packs/day: 0.3 packs/day for 20.0 years (5.0 ttl pk-yrs)    Types: Cigarettes   Smokeless tobacco: Never  Vaping Use   Vaping status: Never Used  Substance and Sexual Activity   Alcohol use: Yes    Alcohol/week: 8.0 standard drinks of alcohol    Types: 8 Cans of beer per week   Drug use: No   Sexual activity: Yes  Other Topics Concern   Not on file  Social History Narrative   Not on file   Social Determinants of Health   Financial Resource Strain: High Risk (12/08/2021)   Received [facility]   Overall  Financial Resource Strain (CARDIA)    Difficulty of Paying Living Expenses: Hard  Food Insecurity: Food Insecurity Present (12/08/2021)   [facility]   Hunger Vital Sign    Worried About Running Out of Food in the Last Year: Often true    Ran Out of Food in the Last Year: Often true  Transportation Needs: No Transportation Needs (12/08/2021)   [facility]   PRAPARE - Transportation    Lack of Transportation (Medical): No    Lack of Transportation (Non-Medical): No  Physical Activity: Not on file  Stress: Not on file  Social Connections: Unknown (10/08/2022)   Received from [facility]   Social Network    Social Network: Not on file  Intimate Partner Violence: Unknown (10/08/2022)   Received from [facility]   HITS    Physically Hurt: Not on file    Insult or Talk Down To: Not on file    Threaten Physical Harm: Not on file    Scream or Curse: Not on file   Review of Systems:  Review of Systems  Respiratory:  Negative for shortness of breath.   Cardiovascular:  Negative for chest pain.  Gastrointestinal:  Positive for abdominal pain, nausea and vomiting.       Hematemesis.    OBJECTIVE:   Temp:  [98.2 F  (36.8 C)-98.4 F (36.9 C)] 98.2 F (36.8 C) (08/31 0629) Pulse Rate:  [52-66] 52 (08/31 0800) Resp:  [16-21] 17 (08/31 0800) BP: (129-143)/(59-82) 134/69 (08/31 0800) SpO2:  [94 %-100 %] 94 % (08/31 0800) Weight:  [106.6 kg] 106.6 kg (08/30 1927)   Physical Exam Constitutional:      General: He is not in acute distress.    Appearance: He is not ill-appearing, toxic-appearing or diaphoretic.  Cardiovascular:     Rate and Rhythm: Normal rate and regular rhythm.  Pulmonary:     Effort: No respiratory distress.     Breath sounds: Normal breath sounds.  Abdominal:     General: Bowel sounds are normal. There is no distension.     Palpations: Abdomen is soft.     Tenderness: There is abdominal tenderness (diffuse, epigastric). There is no guarding.  Musculoskeletal:     Right lower leg: No edema.     Left lower leg: No edema.  Skin:    General: Skin is warm and dry.     Comments: Incisions on anterior abdomen appear well healing.  Neurological:     Mental Status: He is alert.     Labs: Recent Labs    12/21/22 1936 12/22/22 0556  WBC 9.7 9.0  HGB 13.9 14.1  HCT 41.1 41.0  PLT 151 143*   BMET Recent Labs    12/21/22 1936 12/22/22 0556  NA 133* 136  K 3.4* 4.3  CL 104 106  CO2 18* 20*  GLUCOSE 89 164*  BUN 18 16  CREATININE 1.23 1.38*  CALCIUM 8.8* 9.0   LFT Recent Labs    12/21/22 1936  PROT 7.9  ALBUMIN 3.8  AST 47*  ALT 52*  ALKPHOS 64  BILITOT 0.8   PT/INR Recent Labs    12/21/22 2028  LABPROT 13.7  INR 1.0    Diagnostic imaging: CT ABDOMEN PELVIS W CONTRAST  Result Date: 12/21/2022 CLINICAL DATA:  Postop abdominal pain following cholecystectomy 2 and half weeks ago. Diarrhea and vomiting with blood in vomit. EXAM: CT ABDOMEN AND PELVIS WITH CONTRAST TECHNIQUE: Multidetector CT imaging of the  abdomen and pelvis was performed using the standard protocol following bolus administration of intravenous contrast. RADIATION DOSE REDUCTION: This exam  was performed according to the departmental dose-optimization program which includes automated exposure control, adjustment of the mA and/or kV according to patient size and/or use of iterative reconstruction technique. CONTRAST:  OMNIPAQUE IOHEXOL 300 MG/ML  SOLN COMPARISON:  09/17/2022. FINDINGS: Lower chest: No acute abnormality. Hepatobiliary: No focal liver abnormality is seen. Fatty infiltration of the liver is noted. Status post cholecystectomy with minimal residual fat stranding in the surgical bed. No abscess is seen. No biliary dilatation. Pancreas: Unremarkable. No pancreatic ductal dilatation or surrounding inflammatory changes. Spleen: Normal in size without focal abnormality. Adrenals/Urinary Tract: The adrenal glands are within normal limits. The kidneys enhance symmetrically. No renal or ureteral calculus or hydronephrosis. The bladder is unremarkable. Stomach/Bowel: Stomach is within normal limits. Appendix appears normal. No evidence of bowel wall thickening, distention, or inflammatory changes. No free air or pneumatosis. Vascular/Lymphatic: Aortic atherosclerosis. No enlarged abdominal or pelvic lymph nodes. Reproductive: Prostate is unremarkable. Other: No abdominopelvic ascites. Calcifications are noted in the mesentery in the left lower quadrant suggesting fat necrosis. A small fat containing umbilical hernia is present. Musculoskeletal: No acute osseous abnormality. IMPRESSION: 1. Status post cholecystectomy with minimal residual fat stranding in the gallbladder fossa. No abscess is seen. 2. Hepatic steatosis. Electronically Signed   By: Thornell Sartorius M.D.   On: 12/21/2022 21:00   DG Chest Portable 1 View  Result Date: 12/21/2022 CLINICAL DATA:  Vomiting blood.  Recent cholecystectomy. EXAM: PORTABLE CHEST 1 VIEW COMPARISON:  01/08/2022 FINDINGS: The cardiomediastinal contours are normal. No evidence of pneumomediastinum. The lungs are clear. Pulmonary vasculature is normal. No  consolidation, pleural effusion, or pneumothorax. No acute osseous abnormalities are seen. IMPRESSION: No acute chest findings. Electronically Signed   By: Narda Rutherford M.D.   On: 12/21/2022 20:33    IMPRESSION: Hematemesis, stable  -NSAID use since lap chole 12/05/22 Advanced liver fibrosis  -History HCV s/p Epclusa with SVR  -Current EtOH use   -No prior EGD Status post laparoscopic cholecystectomy 12/05/22 Hypertension  PLAN: -Recommend EGD to further evaluate hematemesis, discussed procedure in detail with patient including benefits, alternatives and risks of bleeding/infection/perforation/anesthesia, he verbalized understanding and elected to proceed -Undetermined endoscopy availability today, maintain NPO for now -Continue IV PPI and octreotide for now -Trend H/H, transfuse for Hgb < 7  -Avoid all NSAIDs moving forward  -Alcohol cessation as recommended by hepatologist [facility] will follow and update on procedural timing as appropriate   LOS: 1 day   Liliane Shi, [facility]enter[facility]

## 2022-12-22 NOTE — Transfer of Care (Signed)
Immediate Anesthesia Transfer of Care Note  Patient: Ronnie Burton  Procedure(s) Performed: ESOPHAGOGASTRODUODENOSCOPY (EGD) BIOPSY  Patient Location: PACU  Anesthesia Type:MAC  Level of Consciousness: awake, drowsy, and patient cooperative  Airway & Oxygen Therapy: Patient Spontanous Breathing and Patient connected to nasal cannula oxygen  Post-op Assessment: Report given to RN and Post -op Vital signs reviewed and stable  Post vital signs: Reviewed and stable  Last Vitals:  Vitals Value Taken Time  BP 120/61 12/22/22 1445  Temp    Pulse 49 12/22/22 1447  Resp 15 12/22/22 1447  SpO2 97 % 12/22/22 1447  Vitals shown include unfiled device data.  Last Pain:  Vitals:   12/22/22 1445  TempSrc:   PainSc: 0-No pain         Complications: No notable events documented.

## 2022-12-22 NOTE — Anesthesia Postprocedure Evaluation (Signed)
Anesthesia Post Note  Patient: Ronnie Burton  Procedure(s) Performed: ESOPHAGOGASTRODUODENOSCOPY (EGD) BIOPSY     Patient location during evaluation: PACU Anesthesia Type: MAC Level of consciousness: awake and alert Pain management: pain level controlled Vital Signs Assessment: post-procedure vital signs reviewed and stable Respiratory status: spontaneous breathing, nonlabored ventilation and respiratory function stable Cardiovascular status: stable and blood pressure returned to baseline Anesthetic complications: no   No notable events documented.  Last Vitals:  Vitals:   12/22/22 1445 12/22/22 1448  BP: 120/61 125/62  Pulse: (!) 49 (!) 49  Resp: 18 15  Temp: (!) 36.2 C   SpO2: 96% 97%    Last Pain:  Vitals:   12/22/22 1448  TempSrc:   PainSc: 0-No pain                 Beryle Lathe

## 2022-12-22 NOTE — Plan of Care (Signed)
PT will be NPO after midnight tonight for EGD repeat tomorrow. PT has had no episodes of vomiting. Will continue to assess.

## 2022-12-22 NOTE — Brief Op Note (Signed)
12/22/2022  2:40 PM  PATIENT:  Dawon Sokolowski  62 y.o. male  PRE-OPERATIVE DIAGNOSIS:  Hematemesis  POST-OPERATIVE DIAGNOSIS:  Gastritis.  Esopagitis.  Food present.  PROCEDURE:  Procedure(s): ESOPHAGOGASTRODUODENOSCOPY (EGD) (N/A) BIOPSY  SURGEON:  Surgeons and Role:    Lynann Bologna, DO - Primary  Recommendations:  -Significant amount of retained food along greater curvature of stomach, precluding visualization, no sign of active bleeding at time of exam, 2 small clean based ulcerations on superior lesser gastric curvature, gastritis status post biopsy -Follow up pathology, suspect erosions could be secondary to NSAID use since his cholecystectomy surgery -Recommend repeat EGD tomorrow given poor visualization today -Clear liquid diet this evening, Reglan 10 mg IV once this evening to mobilize stomach contents, NPO at midnight for repeat EGD tomorrow  Liliane Shi, DO [facility]astroenter[facility]

## 2022-12-23 ENCOUNTER — Encounter (HOSPITAL_COMMUNITY): Payer: Self-pay | Admitting: Internal Medicine

## 2022-12-23 ENCOUNTER — Inpatient Hospital Stay (HOSPITAL_COMMUNITY): Payer: MEDICAID | Admitting: Anesthesiology

## 2022-12-23 ENCOUNTER — Encounter (HOSPITAL_COMMUNITY)
Admission: EM | Disposition: A | Discharge status: Home / Self Care | Payer: Self-pay | Source: Home / Self Care | Attending: Family Medicine

## 2022-12-23 DIAGNOSIS — K2970 Gastritis, unspecified, without bleeding: Secondary | ICD-10-CM

## 2022-12-23 DIAGNOSIS — K922 Gastrointestinal hemorrhage, unspecified: Secondary | ICD-10-CM | POA: Diagnosis not present

## 2022-12-23 DIAGNOSIS — K3189 Other diseases of stomach and duodenum: Secondary | ICD-10-CM | POA: Diagnosis not present

## 2022-12-23 DIAGNOSIS — K209 Esophagitis, unspecified without bleeding: Secondary | ICD-10-CM | POA: Diagnosis not present

## 2022-12-23 DIAGNOSIS — I10 Essential (primary) hypertension: Secondary | ICD-10-CM

## 2022-12-23 HISTORY — PX: ESOPHAGOGASTRODUODENOSCOPY: SHX5428

## 2022-12-23 HISTORY — PX: HOT HEMOSTASIS: SHX5433

## 2022-12-23 LAB — BASIC METABOLIC PANEL WITH GFR
Anion gap: 9 (ref 5–15)
BUN: 13 mg/dL (ref 8–23)
CO2: 23 mmol/L (ref 22–32)
Calcium: 9.1 mg/dL (ref 8.9–10.3)
Chloride: 106 mmol/L (ref 98–111)
Creatinine, Ser: 1.19 mg/dL (ref 0.61–1.24)
GFR, Estimated: >60 mL/min (ref >60)
Glucose, Bld: 96 mg/dL (ref 70–99)
Potassium: 4.2 mmol/L (ref 3.5–5.1)
Sodium: 138 mmol/L (ref 135–145)

## 2022-12-23 LAB — CBC
HCT: 39.5 % (ref 39.0–52.0)
Hemoglobin: 13.3 g/dL (ref 13.0–17.0)
MCH: 31.7 pg (ref 26.0–34.0)
MCHC: 33.7 g/dL (ref 30.0–36.0)
MCV: 94.3 fL (ref 80.0–100.0)
Platelets: 120 10*3/uL — ABNORMAL LOW (ref 150–400)
RBC: 4.19 MIL/uL — ABNORMAL LOW (ref 4.22–5.81)
RDW: 11.9 % (ref 11.5–15.5)
WBC: 8.4 10*3/uL (ref 4.0–10.5)
nRBC: 0 % (ref 0.0–0.2)

## 2022-12-23 LAB — PHOSPHORUS: Phosphorus: 3.1 mg/dL (ref 2.5–4.6)

## 2022-12-23 LAB — MAGNESIUM: Magnesium: 2.1 mg/dL (ref 1.7–2.4)

## 2022-12-23 LAB — HIV ANTIBODY (ROUTINE TESTING W REFLEX): HIV Screen 4th Generation wRfx: NONREACTIVE

## 2022-12-23 SURGERY — EGD (ESOPHAGOGASTRODUODENOSCOPY)
Anesthesia: Monitor Anesthesia Care

## 2022-12-23 MED ORDER — PANTOPRAZOLE SODIUM 40 MG PO TBEC: 40.0000 mg | DELAYED_RELEASE_TABLET | Freq: Every day | ORAL | 1 refills | Status: AC

## 2022-12-23 MED ORDER — LIDOCAINE 2% (20 MG/ML) 5 ML SYRINGE
INTRAMUSCULAR | Status: DC | PRN
Start: 2022-12-23 — End: 2022-12-23
  Administered 2022-12-23: 100 mg via INTRAVENOUS

## 2022-12-23 MED ORDER — PROPOFOL 500 MG/50ML IV EMUL
INTRAVENOUS | Status: DC | PRN
  Administered 2022-12-23: 150 ug/kg/min via INTRAVENOUS

## 2022-12-23 MED ORDER — LACTATED RINGERS IV SOLN
INTRAVENOUS | Status: DC | PRN
Start: 2022-12-23 — End: 2022-12-23

## 2022-12-23 MED ORDER — PROPOFOL 10 MG/ML IV BOLUS
INTRAVENOUS | Status: DC | PRN
  Administered 2022-12-23: 30 mg via INTRAVENOUS
  Administered 2022-12-23: 20 mg via INTRAVENOUS

## 2022-12-23 MED ORDER — PANTOPRAZOLE SODIUM 40 MG PO TBEC: 40.0000 mg | DELAYED_RELEASE_TABLET | Freq: Every day | ORAL | 1 refills | Status: DC

## 2022-12-23 MED ORDER — PROPOFOL 500 MG/50ML IV EMUL
INTRAVENOUS | Status: AC
  Filled 2022-12-23: qty 100

## 2022-12-23 MED ORDER — LACTATED RINGERS IV SOLN
INTRAVENOUS | Status: AC | PRN
Start: 2022-12-23 — End: 2022-12-23
  Administered 2022-12-23: 1000 mL via INTRAVENOUS

## 2022-12-23 NOTE — Plan of Care (Signed)
  Problem: Education: Goal: Knowledge of General Education information will improve Description: Including pain rating scale, medication(s)/side effects and non-pharmacologic comfort measures 12/23/2022 0410 by Gareth Eagle, RN Outcome: Progressing 12/23/2022 0410 by Gareth Eagle, RN Outcome: Progressing   Problem: Clinical Measurements: Goal: Ability to maintain clinical measurements within normal limits will improve Outcome: Progressing Goal: Will remain free from infection Outcome: Progressing Goal: Diagnostic test results will improve Outcome: Progressing Goal: Respiratory complications will improve Outcome: Progressing Goal: Cardiovascular complication will be avoided Outcome: Progressing   Problem: Activity: Goal: Risk for activity intolerance will decrease Outcome: Progressing   Problem: Nutrition: Goal: Adequate nutrition will be maintained Outcome: Progressing

## 2022-12-23 NOTE — Anesthesia Postprocedure Evaluation (Signed)
Anesthesia Post Note  Patient: Ronnie Burton  Procedure(s) Performed: ESOPHAGOGASTRODUODENOSCOPY (EGD) HOT HEMOSTASIS (ARGON PLASMA COAGULATION/BICAP)     Patient location during evaluation: PACU Anesthesia Type: MAC Level of consciousness: awake and alert Pain management: pain level controlled Vital Signs Assessment: post-procedure vital signs reviewed and stable Respiratory status: spontaneous breathing, nonlabored ventilation and respiratory function stable Cardiovascular status: stable and blood pressure returned to baseline Postop Assessment: no apparent nausea or vomiting Anesthetic complications: no   No notable events documented.  Last Vitals:  Vitals:   12/23/22 0804 12/23/22 0810  BP:  130/69  Pulse: (!) 56 (!) 51  Resp: 15 13  Temp:    SpO2: 97% 98%    Last Pain:  Vitals:   12/23/22 0841  TempSrc:   PainSc: 0-No pain                 Fernandez Kenley

## 2022-12-23 NOTE — Anesthesia Preprocedure Evaluation (Addendum)
Anesthesia Evaluation  Patient identified by MRN, date of birth, ID band Patient awake    Reviewed: Allergy & Precautions, NPO status , Patient's Chart, lab work & pertinent test results  History of Anesthesia Complications Negative for: history of anesthetic complications  Airway Mallampati: I  TM Distance: >3 FB Neck ROM: Full    Dental  (+) Edentulous Upper, Edentulous Lower, Dental Advisory Given   Pulmonary neg shortness of breath, neg COPD, neg recent URI, Current Smoker and Patient abstained from smoking.   breath sounds clear to auscultation       Cardiovascular hypertension, (-) angina + Past MI   Rhythm:Regular     Neuro/Psych  Headaches PSYCHIATRIC DISORDERS   Bipolar Disorder    Schizoaffective d/o    GI/Hepatic ,,,(+)     substance abuse  alcohol use and IV drug use, Hepatitis -, C Hematemesis yesterday. Denies nausea today     Endo/Other  negative endocrine ROS    Renal/GU Renal InsufficiencyRenal disease     Musculoskeletal negative musculoskeletal ROS (+)    Abdominal   Peds  Hematology  Plt 143k    Anesthesia Other Findings   Reproductive/Obstetrics                             Anesthesia Physical Anesthesia Plan  ASA: 3  Anesthesia Plan: MAC   Post-op Pain Management: Minimal or no pain anticipated   Induction:   PONV Risk Score and Plan: 0 and Treatment may vary due to age or medical condition and Propofol infusion  Airway Management Planned: Nasal Cannula and Natural Airway  Additional Equipment: None  Intra-op Plan:   Post-operative Plan:   Informed Consent: I have reviewed the patients History and Physical, chart, labs and discussed the procedure including the risks, benefits and alternatives for the proposed anesthesia with the patient or authorized representative who has indicated his/her understanding and acceptance.     Dental advisory  given  Plan Discussed with: CRNA and Anesthesiologist  Anesthesia Plan Comments:        Anesthesia Quick Evaluation

## 2022-12-23 NOTE — Interval H&P Note (Signed)
History and Physical Interval Note:  12/23/2022 7:29 AM  Ronnie Burton  has presented today for surgery, with the diagnosis of Hematemesis.  The various methods of treatment have been discussed with the patient and family. After consideration of risks, benefits and other options for treatment, the patient has consented to  Procedure(s): ESOPHAGOGASTRODUODENOSCOPY (EGD) (N/A) as a surgical intervention.  The patient's history has been reviewed, patient examined, no change in status, stable for surgery.  I have reviewed the patient's chart and labs.  Questions were answered to the patient's satisfaction.     Lynann Bologna

## 2022-12-23 NOTE — Brief Op Note (Signed)
12/23/2022  7:58 AM  PATIENT:  Ronnie Burton  62 y.o. male  PRE-OPERATIVE DIAGNOSIS:  Hematemesis  POST-OPERATIVE DIAGNOSIS:  Gastric body ulcer with visible vessel status post coagulation with gold probe cautery, gastritis, esophagitis  PROCEDURE:  Procedure(s): ESOPHAGOGASTRODUODENOSCOPY (EGD) (N/A) HOT HEMOSTASIS (ARGON PLASMA COAGULATION/BICAP) (N/A)  SURGEON:  Surgeons and Role:    Lynann Bologna, DO - Primary  Recommendations:  -Oral PPI once per day for 2 months -Avoid all NSAIDs for pain control -Follow up pathology results -Diet as tolerated -Ok for discharge from GI standpoint as no further hematemesis and ulcer treated -[facility] will sign off and be available as needed  Liliane Shi, DO [facility]

## 2022-12-23 NOTE — Discharge Instructions (Signed)
Advised to follow-up with gastroenterology as scheduled. Advised to take pantoprazole 40 mg daily for 60 days. Advised to avoid ibuprofen, alcohol, tobacco use.

## 2022-12-23 NOTE — Transfer of Care (Signed)
Immediate Anesthesia Transfer of Care Note  Patient: Ronnie Burton  Procedure(s) Performed: ESOPHAGOGASTRODUODENOSCOPY (EGD) HOT HEMOSTASIS (ARGON PLASMA COAGULATION/BICAP)  Patient Location: PACU  Anesthesia Type:MAC  Level of Consciousness: sedated  Airway & Oxygen Therapy: Patient Spontanous Breathing and Patient connected to face mask oxygen  Post-op Assessment: Report given to RN and Post -op Vital signs reviewed and stable  Post vital signs: Reviewed and stable  Last Vitals:  Vitals Value Taken Time  BP    Temp    Pulse    Resp    SpO2      Last Pain:  Vitals:   12/23/22 0725  TempSrc: Temporal  PainSc: 4          Complications: No notable events documented.

## 2022-12-23 NOTE — Op Note (Signed)
[facility] Patient Name: Ronnie Burton Procedure Date: 12/23/2022 MRN: [id number] Attending MD: Liliane Shi DO, DO, [id number] Date of Birth: 1961/03/31 CSN: [id number] Age: 62 Admit Type: Inpatient Procedure:                Upper GI endoscopy Indications:              Hematemesis Providers:                Liliane Shi DO, DO, Eliberto Ivory, RN, Rozetta Nunnery, Technician Referring MD:              Medicines:                See the Anesthesia note for documentation of the                            administered medications Complications:            No immediate complications. Estimated Blood Loss:     Estimated blood loss: none. Procedure:                Pre-Anesthesia Assessment:                           - ASA Grade Assessment: III - A patient with severe                            systemic disease.                           - The risks and benefits of the procedure and the                            sedation options and risks were discussed with the                            patient. All questions were answered and informed                            consent was obtained.                           After obtaining informed consent, the endoscope was                            passed under direct vision. Throughout the                            procedure, the patient's blood pressure, pulse, and                            oxygen saturations were monitored continuously. The                            [id number]-[id number] ([id number]) Olympus endoscope was introduced  through the mouth, and advanced to the second part                            of duodenum. The upper GI endoscopy was                            accomplished without difficulty. The patient                            tolerated the procedure well. Scope In: Scope Out: Findings:      LA Grade A (one or more mucosal breaks less than 5 mm, not extending        between tops of 2 mucosal folds) esophagitis with no bleeding was found       40 cm from the incisors.      One non-bleeding cratered gastric ulcer with a visible vessel was found       on the lesser curvature of the stomach. The lesion was 4 mm in largest       dimension. Coagulation for bleeding prevention using bipolar probe was       successful. Estimated blood loss: none.      Scattered mild inflammation characterized by congestion (edema) and       erythema was found in the gastric antrum.      The examined duodenum was normal. Impression:               - LA Grade A esophagitis with no bleeding.                           - Non-bleeding gastric ulcer with a visible vessel.                            Treated with bipolar cautery.                           - Gastritis.                           - Normal examined duodenum.                           - No specimens collected. Moderate Sedation:      See the other procedure note for documentation of moderate sedation with       intraservice time. Recommendation:           - Return patient to hospital ward for possible                            discharge same day.                           - Resume regular diet.                           - Continue present medications.                           - Recommend oral PPI once  daily for 2 months.                           - Follow up pathology results from previous EGD on                            12/22/22.                           - Avoid all NSAIDs (suspected etiology of gastric                            ulcer). Procedure Code(s):        --- Professional ---                           [phone], Esophagogastroduodenoscopy, flexible,                            transoral; with control of bleeding, any method Diagnosis Code(s):        --- Professional ---                           K20.90, Esophagitis, unspecified without bleeding                           K25.4, Chronic or unspecified gastric  ulcer with                            hemorrhage                           K29.70, Gastritis, unspecified, without bleeding                           K92.0, Hematemesis CPT copyright 2022 [employer]. All rights reserved. The codes documented in this report are preliminary and upon coder review may  be revised to meet current compliance requirements. Dr Liliane Shi, DO Liliane Shi DO, DO 12/23/2022 8:08:28 AM Number of Addenda: 0

## 2022-12-23 NOTE — Discharge Summary (Signed)
Physician Discharge Summary  Ronnie Burton ZOX:[id number] DOB: March 11, 1961 DOA: 12/21/2022  PCP: Fleet Contras, MD  Admit date: 12/21/2022  Discharge date: 12/23/2022  Admitted From: Home.  Disposition:  Home.  Recommendations for Outpatient Follow-up:  Follow up with PCP in 1-2 weeks. Please obtain BMP/CBC in one week. Advised to follow-up with Gastroenterology as scheduled. Advised to take pantoprazole 40 mg daily for 60 days. Advised to avoid ibuprofen, alcohol, tobacco use.  Home Health: None Equipment/Devices: None  Discharge Condition: Stable CODE STATUS:Full code Diet recommendation: Heart Healthy   Brief [employer] Course: This 62 years old male with PMH significant for hepatitis C, questionable cirrhosis, history of laparoscopic cholecystectomy 2 weeks ago presented in the ED with abrupt onset of vomiting and abdominal pain.  Patient states he was in usual state of health after cholecystectomy which has been managed with ibuprofen.  He started throwing up blood , reports has vomited 7 times, small amounts.Vitals are stable, H&H stable.  Patient was admitted for further evaluation.  Patient was started on IV pantoprazole. GI was consulted.  Patient underwent EGD found to have small gastric ulcer with visible vessel , treated with cautery.  Patient felt better,  Patient was started on diet.Patient wants to be discharged. GI signed off.  Advised to avoid alcohol, NSAIDs.  Patient is being discharged home.  Discharge Diagnoses:  Principal Problem:   Upper GI bleeding Active Problems:   Hepatitis C   Cigarette smoker   Alcohol abuse   GI bleeding  Upper GI bleeding: Patient presented with throwing up blood. Patient is s/p laparoscopic cholecystectomy 2 weeks ago,  has been taking ibuprofen as a pain control. There is no further episodes of hematemesis in the ED. Hemodynamically stable.  H&H is stable.   Continue pantoprazole 40 mg IV every 12 hours. Continue IV  fluid resuscitation.   Factors include smoking, alcohol use as well as ibuprofen use.   GI consulted.  Patient underwent EGD EGD showed  lots of retained food precluding ability to visualize entire stomach, saw possible small clean based ulcer in proximal stomach, some gastritis. No varices.  EGD showed small gastric ulcer seen with visible vessel treated with cautery. GI recommended pantoprazole 40 mg daily for 60 days.  Avoid EtOH, smoking, NSAIDs. Patient being discharged home.   Alcohol abuse: Advised to quit given liver disease.   Cigarette smoker Routine replacement therapy, advised to quit smoking.   Hepatitis C Outpatient follow-up  Discharge Instructions  Discharge Instructions     Call MD for:  difficulty breathing, headache or visual disturbances   Complete by: As directed    Call MD for:  persistant dizziness or light-headedness   Complete by: As directed    Diet - low sodium heart healthy   Complete by: As directed    Diet Carb Modified   Complete by: As directed    Discharge instructions   Complete by: As directed    Advised to follow-up with primary care physician in 1 week. Advised to follow-up with gastroenterology as scheduled. Advised to take pantoprazole 40 mg daily for 60 days. Advised to avoid ibuprofen, alcohol, tobacco use.   Increase activity slowly   Complete by: As directed       Allergies as of 12/23/2022   No Known Allergies      Medication List     STOP taking these medications    amoxicillin-clavulanate 875-125 MG tablet Commonly known as: AUGMENTIN   ibuprofen 600 MG tablet Commonly known as: ADVIL  TAKE these medications    albuterol 108 (90 Base) MCG/ACT inhaler Commonly known as: VENTOLIN HFA Inhale 1-2 puffs into the lungs every 6 (six) hours as needed for wheezing or shortness of breath.   fluticasone 50 MCG/ACT nasal spray Commonly known as: FLONASE Place 1 spray into both nostrils daily as needed for  allergies or rhinitis.   methocarbamol 750 MG tablet Commonly known as: Robaxin-750 Take 1 tablet (750 mg total) by mouth every 6 (six) hours as needed for muscle spasms.   oxyCODONE-acetaminophen 5-325 MG tablet Commonly known as: Percocet Take 1 tablet by mouth every 4 (four) hours as needed for severe pain.   pantoprazole 40 MG tablet Commonly known as: Protonix Take 1 tablet (40 mg total) by mouth daily.   Vitamin D (Ergocalciferol) 1.25 MG (50000 UNIT) Caps capsule Commonly known as: DRISDOL Take 50,000 Units by mouth once a week.        Follow-up Information     Fleet Contras, MD Follow up in 1 week(s).   Specialty: Internal Medicine Contact information: [address] [place] [address] [phone]                No Known Allergies  Consultations: Gastroenetrology   Procedures/Studies: CT ABDOMEN PELVIS W CONTRAST  Result Date: 12/21/2022 CLINICAL DATA:  Postop abdominal pain following cholecystectomy 2 and half weeks ago. Diarrhea and vomiting with blood in vomit. EXAM: CT ABDOMEN AND PELVIS WITH CONTRAST TECHNIQUE: Multidetector CT imaging of the abdomen and pelvis was performed using the standard protocol following bolus administration of intravenous contrast. RADIATION DOSE REDUCTION: This exam was performed according to the departmental dose-optimization program which includes automated exposure control, adjustment of the mA and/or kV according to patient size and/or use of iterative reconstruction technique. CONTRAST:  OMNIPAQUE IOHEXOL 300 MG/ML  SOLN COMPARISON:  09/17/2022. FINDINGS: Lower chest: No acute abnormality. Hepatobiliary: No focal liver abnormality is seen. Fatty infiltration of the liver is noted. Status post cholecystectomy with minimal residual fat stranding in the surgical bed. No abscess is seen. No biliary dilatation. Pancreas: Unremarkable. No pancreatic ductal dilatation or surrounding inflammatory changes. Spleen:  Normal in size without focal abnormality. Adrenals/Urinary Tract: The adrenal glands are within normal limits. The kidneys enhance symmetrically. No renal or ureteral calculus or hydronephrosis. The bladder is unremarkable. Stomach/Bowel: Stomach is within normal limits. Appendix appears normal. No evidence of bowel wall thickening, distention, or inflammatory changes. No free air or pneumatosis. Vascular/Lymphatic: Aortic atherosclerosis. No enlarged abdominal or pelvic lymph nodes. Reproductive: Prostate is unremarkable. Other: No abdominopelvic ascites. Calcifications are noted in the mesentery in the left lower quadrant suggesting fat necrosis. A small fat containing umbilical hernia is present. Musculoskeletal: No acute osseous abnormality. IMPRESSION: 1. Status post cholecystectomy with minimal residual fat stranding in the gallbladder fossa. No abscess is seen. 2. Hepatic steatosis. Electronically Signed   By: Thornell Sartorius M.D.   On: 12/21/2022 21:00   DG Chest Portable 1 View  Result Date: 12/21/2022 CLINICAL DATA:  Vomiting blood.  Recent cholecystectomy. EXAM: PORTABLE CHEST 1 VIEW COMPARISON:  01/08/2022 FINDINGS: The cardiomediastinal contours are normal. No evidence of pneumomediastinum. The lungs are clear. Pulmonary vasculature is normal. No consolidation, pleural effusion, or pneumothorax. No acute osseous abnormalities are seen. IMPRESSION: No acute chest findings. Electronically Signed   By: Narda Rutherford M.D.   On: 12/21/2022 20:33    Subjective: Patient was seen and examined at bedside.  Overnight events noted.   Patient reports doing much better and wants  to be discharged.  Patient is being discharged home.  Discharge Exam: Vitals:   12/23/22 0804 12/23/22 0810  BP:  130/69  Pulse: (!) 56 (!) 51  Resp: 15 13  Temp:    SpO2: 97% 98%   Vitals:   12/23/22 0800 12/23/22 0801 12/23/22 0804 12/23/22 0810  BP: (!) 109/45 (!) 109/45  130/69  Pulse: (!) 51 (!) 51 (!) 56 (!) 51   Resp: 16 16 15 13   Temp: 97.7 F (36.5 C)     TempSrc: Temporal     SpO2: 100% 99% 97% 98%  Weight:      Height:        General: Pt is alert, awake, not in acute distress Cardiovascular: RRR, S1/S2 +, no rubs, no gallops Respiratory: CTA bilaterally, no wheezing, no rhonchi Abdominal: Soft, NT, ND, bowel sounds + Extremities: no edema, no cyanosis    The results of significant diagnostics from this hospitalization (including imaging, microbiology, ancillary and laboratory) are listed below for reference.     Microbiology: No results found for this or any previous visit (from the past 240 hour(s)).   Labs: BNP (last 3 results) No results for input(s): "BNP" in the last 8760 hours. Basic Metabolic Panel: Recent Labs  Lab 12/21/22 1936 12/22/22 0556 12/23/22 0349  NA 133* 136 138  K 3.4* 4.3 4.2  CL 104 106 106  CO2 18* 20* 23  GLUCOSE 89 164* 96  BUN 18 16 13   CREATININE 1.23 1.38* 1.19  CALCIUM 8.8* 9.0 9.1  MG  --   --  2.1  PHOS  --   --  3.1   Liver Function Tests: Recent Labs  Lab 12/21/22 1936  AST 47*  ALT 52*  ALKPHOS 64  BILITOT 0.8  PROT 7.9  ALBUMIN 3.8   Recent Labs  Lab 12/21/22 1936  LIPASE 42   No results for input(s): "AMMONIA" in the last 168 hours. CBC: Recent Labs  Lab 12/21/22 1936 12/22/22 0556 12/23/22 0349  WBC 9.7 9.0 8.4  HGB 13.9 14.1 13.3  HCT 41.1 41.0 39.5  MCV 93.2 93.6 94.3  PLT 151 143* 120*   Cardiac Enzymes: No results for input(s): "CKTOTAL", "CKMB", "CKMBINDEX", "TROPONINI" in the last 168 hours. BNP: Invalid input(s): "POCBNP" CBG: No results for input(s): "GLUCAP" in the last 168 hours. D-Dimer No results for input(s): "DDIMER" in the last 72 hours. Hgb A1c No results for input(s): "HGBA1C" in the last 72 hours. Lipid Profile No results for input(s): "CHOL", "HDL", "LDLCALC", "TRIG", "CHOLHDL", "LDLDIRECT" in the last 72 hours. Thyroid function studies No results for input(s): "TSH", "T4TOTAL",  "T3FREE", "THYROIDAB" in the last 72 hours.  Invalid input(s): "FREET3" Anemia work up No results for input(s): "VITAMINB12", "FOLATE", "FERRITIN", "TIBC", "IRON", "RETICCTPCT" in the last 72 hours. Urinalysis    Component Value Date/Time   COLORURINE STRAW (A) 12/21/2022 1933   APPEARANCEUR CLEAR 12/21/2022 1933   LABSPEC 1.002 (L) 12/21/2022 1933   PHURINE 6.0 12/21/2022 1933   GLUCOSEU NEGATIVE 12/21/2022 1933   HGBUR NEGATIVE 12/21/2022 1933   BILIRUBINUR NEGATIVE 12/21/2022 1933   BILIRUBINUR negative 09/17/2022 1044   KETONESUR NEGATIVE 12/21/2022 1933   PROTEINUR NEGATIVE 12/21/2022 1933   UROBILINOGEN 0.2 09/17/2022 1044   NITRITE NEGATIVE 12/21/2022 1933   LEUKOCYTESUR NEGATIVE 12/21/2022 1933   Sepsis Labs Recent Labs  Lab 12/21/22 1936 12/22/22 0556 12/23/22 0349  WBC 9.7 9.0 8.4   Microbiology No results found for this or any previous visit (from the past  240 hour(s)).   Time coordinating discharge: Over 30 minutes  SIGNED:   Willeen Niece, MD  [facility] 12/23/2022, 2:57 PM Pager   If 7PM-7AM, please contact night-coverage

## 2022-12-24 ENCOUNTER — Encounter (HOSPITAL_COMMUNITY): Payer: Self-pay | Admitting: Internal Medicine

## 2022-12-27 LAB — SURGICAL PATHOLOGY

## 2023-02-04 ENCOUNTER — Other Ambulatory Visit: Payer: Self-pay | Admitting: Nurse Practitioner

## 2023-02-04 DIAGNOSIS — K7402 Hepatic fibrosis, advanced fibrosis: Secondary | ICD-10-CM

## 2023-02-04 DIAGNOSIS — Z789 Other specified health status: Secondary | ICD-10-CM

## 2023-02-19 ENCOUNTER — Ambulatory Visit
Admission: RE | Admit: 2023-02-19 | Discharge: 2023-02-19 | Disposition: A | Discharge status: Home / Self Care | Payer: MEDICAID | Source: Ambulatory Visit | Attending: Nurse Practitioner

## 2023-02-19 ENCOUNTER — Other Ambulatory Visit: Payer: MEDICAID

## 2023-02-19 DIAGNOSIS — Z789 Other specified health status: Secondary | ICD-10-CM

## 2023-02-19 DIAGNOSIS — K7402 Hepatic fibrosis, advanced fibrosis: Secondary | ICD-10-CM

## 2023-06-19 ENCOUNTER — Ambulatory Visit (INDEPENDENT_AMBULATORY_CARE_PROVIDER_SITE_OTHER): Payer: MEDICAID | Admitting: Podiatry

## 2023-06-19 ENCOUNTER — Encounter: Payer: Self-pay | Admitting: Podiatry

## 2023-06-19 DIAGNOSIS — M722 Plantar fascial fibromatosis: Secondary | ICD-10-CM | POA: Diagnosis not present

## 2023-06-19 DIAGNOSIS — M62469 Contracture of muscle, unspecified lower leg: Secondary | ICD-10-CM

## 2023-06-19 MED ORDER — CLOTRIMAZOLE-BETAMETHASONE 1-0.05 % EX CREA: 1.0000 | TOPICAL_CREAM | Freq: Every day | CUTANEOUS | 0 refills | Status: AC

## 2023-06-19 NOTE — Progress Notes (Signed)
 Subjective:  Patient ID: Ronnie Burton, male    DOB: 03/01/1961,  MRN: [id number]  Chief Complaint  Patient presents with   polyneuropathy    F/u bilateral neuropathy of feet. Has a rash bilateral feet.     63 y.o. male presents with the above complaint.  Patient presents with bilateral heel pain that has been going on for quite some time is progressive gotten worse worse with ambulation worse with pressure he would like to discuss treatment options for him not seen at [facility] to see me pain scale 7 out of 10 dull aching nature hurts with taking for step in the morning   Review of Systems: Negative except as noted in the HPI. Denies N/V/F/Ch.  Past Medical History:  Diagnosis Date   Bipolar 1 disorder (HCC)    Headache    deneis   Heart murmur    Hepatitis    hep C, treated   Hypertension    Myocardial infarction (HCC)    mild when 30   Schizophrenia (HCC)     Current Outpatient Medications:    clotrimazole-betamethasone (LOTRISONE) cream, Apply 1 Application topically daily., Disp: 30 g, Rfl: 0   albuterol (VENTOLIN HFA) 108 (90 Base) MCG/ACT inhaler, Inhale 1-2 puffs into the lungs every 6 (six) hours as needed for wheezing or shortness of breath., Disp: 18 g, Rfl: 0   fluticasone (FLONASE) 50 MCG/ACT nasal spray, Place 1 spray into both nostrils daily as needed for allergies or rhinitis., Disp: , Rfl:    methocarbamol (ROBAXIN-750) 750 MG tablet, Take 1 tablet (750 mg total) by mouth every 6 (six) hours as needed for muscle spasms., Disp: 30 tablet, Rfl: 0   oxyCODONE-acetaminophen (PERCOCET) 5-325 MG tablet, Take 1 tablet by mouth every 4 (four) hours as needed for severe pain., Disp: 10 tablet, Rfl: 0   pantoprazole (PROTONIX) 40 MG tablet, Take 1 tablet (40 mg total) by mouth daily., Disp: 30 tablet, Rfl: 1   Vitamin D, Ergocalciferol, (DRISDOL) 1.25 MG (50000 UNIT) CAPS capsule, Take 50,000 Units by mouth once a week., Disp: , Rfl:   Social History   Tobacco Use   Smoking Status Every Day   Current packs/day: 0.25   Average packs/day: 0.3 packs/day for 20.0 years (5.0 ttl pk-yrs)   Types: Cigarettes  Smokeless Tobacco Never    No Known Allergies Objective:  There were no vitals filed for this visit. There is no height or weight on file to calculate BMI. Constitutional Well developed. Well nourished.  Vascular Dorsalis pedis pulses palpable bilaterally. Posterior tibial pulses palpable bilaterally. Capillary refill normal to all digits.  No cyanosis or clubbing noted. Pedal hair growth normal.  Neurologic Normal speech. Oriented to person, place, and time. Epicritic sensation to light touch grossly present bilaterally.  Dermatologic Nails well groomed and normal in appearance. No open wounds. No skin lesions.  Orthopedic: Normal joint ROM without pain or crepitus bilaterally. No visible deformities. Tender to palpation at the calcaneal tuber bilaterally. No pain with calcaneal squeeze bilaterally. Ankle ROM diminished range of motion bilaterally. Silfverskiold Test: positive bilaterally.   Radiographs: None  Assessment:   1. Gastrocnemius equinus, unspecified laterality   2. Plantar fasciitis of left foot [M72.2]   3. Plantar fasciitis of right foot    Plan:  Patient was evaluated and treated and all questions answered.  Plantar Fasciitis, bilaterally with underlying gastrocnemius equinus - XR reviewed as above.  - Educated on icing and stretching. Instructions given.  - Injection delivered to the plantar  fascia as below. - DME: Plantar fascial brace dispensed to support the medial longitudinal arch of the foot and offload pressure from the heel and prevent arch collapse during weightbearing - Pharmacologic management: None  Procedure: Injection Tendon/Ligament Location: Bilateral plantar fascia at the glabrous junction; medial approach. Skin Prep: alcohol Injectate: 0.5 cc 0.5% marcaine plain, 0.5 cc of 1% Lidocaine, 0.5  cc kenalog 10. Disposition: Patient tolerated procedure well. Injection site dressed with a band-aid.  No follow-ups on file.

## 2023-07-17 ENCOUNTER — Ambulatory Visit (INDEPENDENT_AMBULATORY_CARE_PROVIDER_SITE_OTHER): Payer: MEDICAID | Admitting: Podiatry

## 2023-07-17 DIAGNOSIS — M722 Plantar fascial fibromatosis: Secondary | ICD-10-CM

## 2023-07-17 DIAGNOSIS — M62469 Contracture of muscle, unspecified lower leg: Secondary | ICD-10-CM

## 2023-07-17 NOTE — Progress Notes (Signed)
 Subjective:  Patient ID: Ronnie Burton, male    DOB: 1960/10/09,  MRN: [id number]  Chief Complaint  Patient presents with   Gastrocnemius equinus, unspecified laterality    Pt stated that he is doing okay he does have some tingling sensations still, he stated that the braces do help a little bit. No new concerns     63 y.o. male presents with the above complaint.  Patient presents with bilateral heel pain that has been going on for quite some time is progressive gotten worse worse with ambulation worse with pressure he would like to discuss treatment options for him not seen at [facility] to see me pain scale 7 out of 10 dull aching nature hurts with taking for step in the morning   Review of Systems: Negative except as noted in the HPI. Denies N/V/F/Ch.  Past Medical History:  Diagnosis Date   Bipolar 1 disorder (HCC)    Headache    deneis   Heart murmur    Hepatitis    hep C, treated   Hypertension    Myocardial infarction (HCC)    mild when 30   Schizophrenia (HCC)     Current Outpatient Medications:    albuterol (VENTOLIN HFA) 108 (90 Base) MCG/ACT inhaler, Inhale 1-2 puffs into the lungs every 6 (six) hours as needed for wheezing or shortness of breath., Disp: 18 g, Rfl: 0   chlorhexidine (PERIDEX) 0.12 % solution, RINSE MOUTH WITH 15 ML (1 CAPFUL) FOR 30 SECONDS MORNING AND EVENING TOOTHBRUSHING. EXPECTORATE AFTER RINSING DO NOT SWALLOW. Mouth/Throat for 30, Disp: , Rfl:    clotrimazole-betamethasone (LOTRISONE) cream, Apply 1 Application topically daily., Disp: 30 g, Rfl: 0   dicyclomine (BENTYL) 10 MG capsule, TAKE 1 CAPSULE BY MOUTH THREE TIMES DAILY AS NEEDED FOR ABDOMINAL CRAMPS. Oral for 10, Disp: , Rfl:    fluticasone (FLONASE) 50 MCG/ACT nasal spray, Place 1 spray into both nostrils daily as needed for allergies or rhinitis., Disp: , Rfl:    gabapentin (NEURONTIN) 100 MG capsule, TAKE 1 CAPSULE BY MOUTH THREE TIMES DAILY Oral for 30, Disp: , Rfl:    methocarbamol  (ROBAXIN-750) 750 MG tablet, Take 1 tablet (750 mg total) by mouth every 6 (six) hours as needed for muscle spasms., Disp: 30 tablet, Rfl: 0   metroNIDAZOLE (FLAGYL) 500 MG tablet, TAKE 1 TABLET BY MOUTH TWICE DAILY Oral for 7, Disp: , Rfl:    oxyCODONE-acetaminophen (PERCOCET) 5-325 MG tablet, Take 1 tablet by mouth every 4 (four) hours as needed for severe pain., Disp: 10 tablet, Rfl: 0   pantoprazole (PROTONIX) 40 MG tablet, Take 1 tablet (40 mg total) by mouth daily., Disp: 30 tablet, Rfl: 1   Vitamin D, Ergocalciferol, (DRISDOL) 1.25 MG (50000 UNIT) CAPS capsule, Take 50,000 Units by mouth once a week., Disp: , Rfl:   Social History   Tobacco Use  Smoking Status Every Day   Current packs/day: 0.25   Average packs/day: 0.3 packs/day for 20.0 years (5.0 ttl pk-yrs)   Types: Cigarettes  Smokeless Tobacco Never    No Known Allergies Objective:  There were no vitals filed for this visit. There is no height or weight on file to calculate BMI. Constitutional Well developed. Well nourished.  Vascular Dorsalis pedis pulses palpable bilaterally. Posterior tibial pulses palpable bilaterally. Capillary refill normal to all digits.  No cyanosis or clubbing noted. Pedal hair growth normal.  Neurologic Normal speech. Oriented to person, place, and time. Epicritic sensation to light touch grossly present bilaterally.  Dermatologic  Nails well groomed and normal in appearance. No open wounds. No skin lesions.  Orthopedic: Normal joint ROM without pain or crepitus bilaterally. No visible deformities. Tender to palpation at the calcaneal tuber bilaterally. No pain with calcaneal squeeze bilaterally. Ankle ROM diminished range of motion bilaterally. Silfverskiold Test: positive bilaterally.   Radiographs: None  Assessment:   1. Plantar fasciitis of left foot [M72.2]   2. Plantar fasciitis of right foot   3. Gastrocnemius equinus, unspecified laterality     Plan:  Patient was  evaluated and treated and all questions answered.  Plantar Fasciitis, bilaterally with underlying gastrocnemius equinus - XR reviewed as above.  - Educated on icing and stretching. Instructions given.  - SecondInjection delivered to the plantar fascia as below. - DME: Plantar fascial brace dispensed to support the medial longitudinal arch of the foot and offload pressure from the heel and prevent arch collapse during weightbearing - Pharmacologic management: None  Procedure: Injection Tendon/Ligament Location: Bilateral plantar fascia at the glabrous junction; medial approach. Skin Prep: alcohol Injectate: 0.5 cc 0.5% marcaine plain, 0.5 cc of 1% Lidocaine, 0.5 cc kenalog 10. Disposition: Patient tolerated procedure well. Injection site dressed with a band-aid.  No follow-ups on file.

## 2023-07-19 ENCOUNTER — Encounter (HOSPITAL_BASED_OUTPATIENT_CLINIC_OR_DEPARTMENT_OTHER): Payer: Self-pay | Admitting: Internal Medicine

## 2023-07-19 DIAGNOSIS — R0683 Snoring: Secondary | ICD-10-CM

## 2023-08-21 ENCOUNTER — Other Ambulatory Visit: Payer: Self-pay | Admitting: Nurse Practitioner

## 2023-08-21 DIAGNOSIS — F1090 Alcohol use, unspecified, uncomplicated: Secondary | ICD-10-CM

## 2023-08-21 DIAGNOSIS — K709 Alcoholic liver disease, unspecified: Secondary | ICD-10-CM

## 2023-08-21 DIAGNOSIS — K7402 Hepatic fibrosis, advanced fibrosis: Secondary | ICD-10-CM

## 2023-08-28 ENCOUNTER — Ambulatory Visit
Admission: RE | Admit: 2023-08-28 | Discharge: 2023-08-28 | Disposition: A | Discharge status: Home / Self Care | Payer: MEDICAID | Source: Ambulatory Visit | Attending: Nurse Practitioner

## 2023-08-28 DIAGNOSIS — F1090 Alcohol use, unspecified, uncomplicated: Secondary | ICD-10-CM

## 2023-08-28 DIAGNOSIS — K7402 Hepatic fibrosis, advanced fibrosis: Secondary | ICD-10-CM

## 2023-08-28 DIAGNOSIS — K709 Alcoholic liver disease, unspecified: Secondary | ICD-10-CM

## 2023-10-04 ENCOUNTER — Encounter (HOSPITAL_BASED_OUTPATIENT_CLINIC_OR_DEPARTMENT_OTHER): Payer: MEDICAID | Admitting: Internal Medicine

## 2023-11-25 ENCOUNTER — Encounter (HOSPITAL_BASED_OUTPATIENT_CLINIC_OR_DEPARTMENT_OTHER): Payer: Self-pay | Admitting: Internal Medicine

## 2023-11-26 NOTE — Procedures (Signed)
 Orders only

## 2024-02-20 ENCOUNTER — Other Ambulatory Visit: Payer: Self-pay | Admitting: Nurse Practitioner

## 2024-02-20 DIAGNOSIS — K7402 Hepatic fibrosis, advanced fibrosis: Secondary | ICD-10-CM

## 2024-02-20 DIAGNOSIS — K709 Alcoholic liver disease, unspecified: Secondary | ICD-10-CM

## 2024-03-17 ENCOUNTER — Ambulatory Visit
Admission: RE | Admit: 2024-03-17 | Discharge: 2024-03-17 | Disposition: A | Discharge status: Home / Self Care | Source: Ambulatory Visit | Attending: Nurse Practitioner | Admitting: Nurse Practitioner

## 2024-03-17 DIAGNOSIS — K709 Alcoholic liver disease, unspecified: Secondary | ICD-10-CM

## 2024-03-17 DIAGNOSIS — K7402 Hepatic fibrosis, advanced fibrosis: Secondary | ICD-10-CM
